# Patient Record
Sex: Male | Born: 1955 | Race: White | Hispanic: No | Marital: Married | Smoking: Former smoker
Health system: Southern US, Community
[De-identification: ages and names within clinical notes are randomized; demographics above are authoritative.]

## PROBLEM LIST (undated history)

## (undated) DIAGNOSIS — E119 Type 2 diabetes mellitus without complications: Secondary | ICD-10-CM

## (undated) HISTORY — PX: SHOULDER ARTHROSCOPY: SHX128

## (undated) HISTORY — PX: COLONOSCOPY: SHX174

## (undated) HISTORY — PX: UMBILICAL HERNIA REPAIR: SHX196

---

## 2000-08-24 ENCOUNTER — Other Ambulatory Visit: Admission: RE | Admit: 2000-08-24 | Discharge: 2000-08-24 | Payer: Self-pay | Admitting: Urology

## 2007-10-26 ENCOUNTER — Ambulatory Visit (HOSPITAL_COMMUNITY): Admission: RE | Admit: 2007-10-26 | Discharge: 2007-10-26 | Payer: Self-pay | Admitting: Family Medicine

## 2008-03-24 ENCOUNTER — Emergency Department (HOSPITAL_COMMUNITY): Admission: EM | Admit: 2008-03-24 | Discharge: 2008-03-24 | Payer: Self-pay | Admitting: Emergency Medicine

## 2008-12-30 ENCOUNTER — Ambulatory Visit (HOSPITAL_COMMUNITY): Admission: RE | Admit: 2008-12-30 | Discharge: 2008-12-30 | Payer: Self-pay | Admitting: General Surgery

## 2009-02-16 ENCOUNTER — Ambulatory Visit (HOSPITAL_COMMUNITY): Admission: RE | Admit: 2009-02-16 | Discharge: 2009-02-16 | Payer: Self-pay | Admitting: General Surgery

## 2009-03-26 DIAGNOSIS — D229 Melanocytic nevi, unspecified: Secondary | ICD-10-CM

## 2009-03-26 HISTORY — DX: Melanocytic nevi, unspecified: D22.9

## 2010-04-11 LAB — CBC
HCT: 45.4 % (ref 39.0–52.0)
Hemoglobin: 15.3 g/dL (ref 13.0–17.0)
MCHC: 33.8 g/dL (ref 30.0–36.0)
MCV: 87.6 fL (ref 78.0–100.0)
Platelets: 199 10*3/uL (ref 150–400)
RBC: 5.18 MIL/uL (ref 4.22–5.81)
RDW: 12.8 % (ref 11.5–15.5)
WBC: 4.1 10*3/uL (ref 4.0–10.5)

## 2010-04-11 LAB — BASIC METABOLIC PANEL
BUN: 15 mg/dL (ref 6–23)
CO2: 26 mEq/L (ref 19–32)
Calcium: 9.5 mg/dL (ref 8.4–10.5)
Chloride: 102 mEq/L (ref 96–112)
Creatinine, Ser: 0.83 mg/dL (ref 0.4–1.5)
GFR calc Af Amer: >60 mL/min (ref >60)
GFR calc non Af Amer: >60 mL/min (ref >60)
Glucose, Bld: 101 mg/dL — ABNORMAL HIGH (ref 70–99)
Potassium: 4.2 mEq/L (ref 3.5–5.1)
Sodium: 136 mEq/L (ref 135–145)

## 2012-05-29 ENCOUNTER — Ambulatory Visit (HOSPITAL_BASED_OUTPATIENT_CLINIC_OR_DEPARTMENT_OTHER): Payer: BC Managed Care – PPO | Attending: Family Medicine | Admitting: Radiology

## 2012-05-29 VITALS — Ht 66.0 in | Wt 235.0 lb

## 2012-05-29 DIAGNOSIS — G4733 Obstructive sleep apnea (adult) (pediatric): Secondary | ICD-10-CM

## 2012-06-02 DIAGNOSIS — G4733 Obstructive sleep apnea (adult) (pediatric): Secondary | ICD-10-CM

## 2012-06-02 NOTE — Procedures (Signed)
NAMEAVERIE, Samuel Madden NO.:  [id number]  MEDICAL RECORD NO.:  [id number]          PATIENT TYPE:  OUT  LOCATION:  SLEEP CENTER                 FACILITY:  [facility]  PHYSICIAN:  Tiajah Oyster D. Maple Hudson, MD, FCCP, FACPDATE OF BIRTH:  25-Jun-1955  DATE OF STUDY:  05/29/2012                           NOCTURNAL POLYSOMNOGRAM  REFERRING PHYSICIAN:  MICHAEL C BADGER  INDICATION FOR STUDY:  Hypersomnia with sleep apnea.  EPWORTH SLEEPINESS SCORE:  11/24.  BMI 37.9.  Weight 235 pounds, height 66 inches.  Neck 17.5 inches.  MEDICATIONS:  Home medications are charted and reviewed.  SLEEP ARCHITECTURE:  Split study protocol.  During the diagnostic phase, total sleep time 120.5 minutes with sleep efficiency 85.2%.  Stage I was 7.1%, stage II 84.2%, stage III absent.  REM 8.7% of total sleep time. Sleep latency 17.5 minutes, REM latency 35 minutes.  Awake after sleep onset 6 minutes.  Arousal index 20.4.  Bedtime medication:  None.  RESPIRATORY DATA:  Split study protocol.  Apnea/hypopnea index (AHI) 22.9 per hour.  A total of 46 events was scored including 1 obstructive apnea and 45 hypopneas.  Events were associated with supine sleep position.  REM AHI 51.4 per hour.  CPAP was titrated to 11 CWP, AHI 0 per hour.  He wore a medium F and P Eson mask with heated humidifier.  OXYGEN DATA:  Moderate snoring before CPAP with oxygen desaturation to a nadir of 72% on room air.  With CPAP titration, snoring was prevented and mean oxygen saturation held 94.7% on room air.  CARDIAC DATA:  Normal sinus rhythm.  MOVEMENT/PARASOMNIA:  No significant movement disturbance.  No bathroom trips.  IMPRESSION/RECOMMENDATION: 1. Moderate obstructive sleep apnea/hypopnea syndrome, AHI 22.9 per     hour with supine events.  Moderate snoring with oxygen desaturation     to a nadir of 72% on room air. 2. Successful CPAP titration to 11 CWP, AHI 0 per hour.  He wore a     medium F and P Eson mask with  heated humidifier.  Snoring was     prevented and mean oxygen saturation held 94.7% on room air with     CPAP.     Jazmene Racz D. Maple Hudson, MD, [facility], FACP Diplomate, American Board of Sleep Medicine    CDY/MEDQ  D:  06/02/2012 14:01:00  T:  06/02/2012 19:47:33  Job:  [id number]

## 2013-04-04 ENCOUNTER — Other Ambulatory Visit: Payer: Self-pay | Admitting: Physician Assistant

## 2016-05-25 DIAGNOSIS — R7303 Prediabetes: Secondary | ICD-10-CM | POA: Diagnosis present

## 2016-12-06 ENCOUNTER — Other Ambulatory Visit: Payer: Self-pay | Admitting: Orthopedic Surgery

## 2016-12-06 DIAGNOSIS — M25521 Pain in right elbow: Secondary | ICD-10-CM

## 2016-12-06 DIAGNOSIS — T148XXA Other injury of unspecified body region, initial encounter: Secondary | ICD-10-CM

## 2016-12-26 ENCOUNTER — Other Ambulatory Visit: Payer: Self-pay

## 2018-02-08 ENCOUNTER — Other Ambulatory Visit: Payer: Self-pay | Admitting: Physician Assistant

## 2018-04-17 ENCOUNTER — Other Ambulatory Visit: Payer: Self-pay | Admitting: Physician Assistant

## 2019-03-31 ENCOUNTER — Ambulatory Visit: Payer: Self-pay | Attending: Internal Medicine

## 2019-03-31 DIAGNOSIS — Z23 Encounter for immunization: Secondary | ICD-10-CM | POA: Insufficient documentation

## 2019-03-31 NOTE — Progress Notes (Signed)
   Covid-19 Vaccination Clinic  Name:  Samuel Madden    MRN: [id number] DOB: [birth year]  03/31/2019  Samuel Madden was observed post Covid-19 immunization for 15 minutes without incident. He was provided with Vaccine Information Sheet and instruction to access the V-Safe system.   Samuel Madden was instructed to call 911 with any severe reactions post vaccine: Marland Kitchen Difficulty breathing  . Swelling of face and throat  . A fast heartbeat  . A bad rash all over body  . Dizziness and weakness   Immunizations Administered    Name Date Dose VIS Date Route   Pfizer COVID-19 Vaccine 03/31/2019  5:54 PM 0.3 mL 01/04/2019 Intramuscular   Manufacturer: [place]   Lot: [id number]   [place]: [id number]

## 2019-05-01 ENCOUNTER — Ambulatory Visit: Payer: Self-pay | Attending: Internal Medicine

## 2019-05-01 DIAGNOSIS — Z23 Encounter for immunization: Secondary | ICD-10-CM

## 2019-05-01 NOTE — Progress Notes (Signed)
   Covid-19 Vaccination Clinic  Name:  Samuel Madden    MRN: [id number] DOB: 02/06/1955  05/01/2019  [name] was observed post Covid-19 immunization for 15 minutes without incident. He was provided with Vaccine Information Sheet and instruction to access the V-Safe system.   [name] was instructed to call 911 with any severe reactions post vaccine: Marland Kitchen Difficulty breathing  . Swelling of face and throat  . A fast heartbeat  . A bad rash all over body  . Dizziness and weakness   Immunizations Administered    Name Date Dose VIS Date Route   Pfizer COVID-19 Vaccine 05/01/2019  9:13 AM 0.3 mL 01/04/2019 Intramuscular   Manufacturer: [employer], [employer]   Lot: [id number]   [place]: [id number]

## 2019-11-04 DIAGNOSIS — Z23 Encounter for immunization: Secondary | ICD-10-CM | POA: Diagnosis not present

## 2019-11-14 ENCOUNTER — Ambulatory Visit: Payer: Self-pay | Admitting: Dermatology

## 2019-11-19 ENCOUNTER — Ambulatory Visit: Payer: Self-pay | Admitting: Dermatology

## 2019-11-21 DIAGNOSIS — Z23 Encounter for immunization: Secondary | ICD-10-CM | POA: Diagnosis not present

## 2019-12-03 ENCOUNTER — Other Ambulatory Visit: Payer: Self-pay

## 2019-12-03 ENCOUNTER — Encounter: Payer: Self-pay | Admitting: Dermatology

## 2019-12-03 ENCOUNTER — Ambulatory Visit: Payer: BC Managed Care – PPO | Admitting: Dermatology

## 2019-12-03 DIAGNOSIS — Z1283 Encounter for screening for malignant neoplasm of skin: Secondary | ICD-10-CM | POA: Diagnosis not present

## 2019-12-03 DIAGNOSIS — D2371 Other benign neoplasm of skin of right lower limb, including hip: Secondary | ICD-10-CM | POA: Diagnosis not present

## 2019-12-03 DIAGNOSIS — H04123 Dry eye syndrome of bilateral lacrimal glands: Secondary | ICD-10-CM | POA: Diagnosis not present

## 2019-12-03 DIAGNOSIS — L739 Follicular disorder, unspecified: Secondary | ICD-10-CM

## 2019-12-03 DIAGNOSIS — H2513 Age-related nuclear cataract, bilateral: Secondary | ICD-10-CM | POA: Diagnosis not present

## 2019-12-03 DIAGNOSIS — D239 Other benign neoplasm of skin, unspecified: Secondary | ICD-10-CM

## 2019-12-03 DIAGNOSIS — H524 Presbyopia: Secondary | ICD-10-CM | POA: Diagnosis not present

## 2019-12-03 MED ORDER — MINOCYCLINE HCL 50 MG PO CAPS: 50.0000 mg | ORAL_CAPSULE | Freq: Two times a day (BID) | ORAL | 5 refills | Status: DC

## 2019-12-03 NOTE — Progress Notes (Signed)
   Follow-Up Visit   Subjective  Samuel Madden is a 64 y.o. male who presents for the following: Annual Exam (no new concerns other than a rash that comes and goes on the back of the scalp/neck- + itch, painful blisters- tx-  OTC products). General skin check Location:  Duration:  Quality:  Associated Signs/Symptoms: Modifying Factors:  Severity:  Timing: Context: Also history of recurrent blisterlike sores on back of scalp.  Not on torso  Objective  Well appearing patient in no apparent distress; mood and affect are within normal limits.  A full examination was performed including scalp, head, eyes, ears, nose, lips, neck, chest, axillae, abdomen, back, buttocks, bilateral upper extremities, bilateral lower extremities, hands, feet, fingers, toes, fingernails, and toenails. All findings within normal limits unless otherwise noted below.   Assessment & Plan    Skin exam for malignant neoplasm Chest - [facility])  Yearly skin checks  Folliculitis Mid Back  Use MCN 50mg  when flares appear.  minocycline (MINOCIN) 50 MG capsule - Mid Back  Dermatofibroma Right Lower Leg - Anterior  Benign okay to leave. Unless patient wants removed.   General skin examination from legs to scalp today.  There are no recurrent or new atypical moles or skin cancer.  On the right shin he has firm 4 mm dermal papule called a dermatofibroma which is safe to leave if stable.  On the hands and the tops of the feet (by history) and the left shin are rough textured bumps called keratoses which are intrinsically benign.  He has extensive follicular breakouts on the scalp and the torso which represent a mixture of folliculitis plus adult acne.  He may restart the oral minocycline 50 mg twice daily if there is a significant flare.  He is well versed on understanding the undesirability of chronic oral antibiotics.  To try to minimize the need for oral antibiotics, I recommend he wash more or less daily  with any over-the-counter benzyl peroxide cleanser.  The easiest might be a PanOxyl bar (either 5 or 10%), but if he is unable to find this there a dozen benzyl peroxide cleansers in the acne section of any over-the-counter area of the grocery store or [place] or [employer].  There is a 1% chance of not being able to tolerate the PanOxyl because of itching or rash.  I will plan on doing a general skin check on [name] annually but he knows that he could call me with any questions.   I, Lavonna Monarch, MD, have reviewed all documentation for this visit.  The documentation on 12/03/19 for the exam, diagnosis, procedures, and orders are all accurate and complete.

## 2019-12-03 NOTE — Patient Instructions (Addendum)
Pick up Panoxyl General skin examination from legs to scalp today.  There are no recurrent or new atypical moles or skin cancer.  On the right shin he has firm 4 mm dermal papule called a dermatofibroma which is safe to leave if stable.  On the hands and the tops of the feet (by history) and the left shin are rough textured bumps called keratoses which are intrinsically benign.  He has extensive follicular breakouts on the scalp and the torso which represent a mixture of folliculitis plus adult acne.  He may restart the oral minocycline 50 mg twice daily if there is a significant flare.  He is well versed on understanding the undesirability of chronic oral antibiotics.  To try to minimize the need for oral antibiotics, I recommend he wash more or less daily with any over-the-counter benzyl peroxide cleanser.  The easiest might be a PanOxyl bar (either 5 or 10%), but if he is unable to find this there a dozen benzyl peroxide cleansers in the acne section of any over-the-counter area of the grocery store or [place] or [employer].  There is a 1% chance of not being able to tolerate the PanOxyl because of itching or rash.  I will plan on doing a general skin check on Mr. Duzan annually but he knows that he could call me with any questions.

## 2019-12-16 ENCOUNTER — Encounter: Payer: Self-pay | Admitting: Dermatology

## 2019-12-17 ENCOUNTER — Ambulatory Visit: Payer: Self-pay

## 2019-12-29 DIAGNOSIS — J018 Other acute sinusitis: Secondary | ICD-10-CM | POA: Diagnosis not present

## 2019-12-29 DIAGNOSIS — Z03818 Encounter for observation for suspected exposure to other biological agents ruled out: Secondary | ICD-10-CM | POA: Diagnosis not present

## 2019-12-29 DIAGNOSIS — U071 COVID-19: Secondary | ICD-10-CM | POA: Diagnosis not present

## 2019-12-29 DIAGNOSIS — R0602 Shortness of breath: Secondary | ICD-10-CM | POA: Diagnosis not present

## 2019-12-29 DIAGNOSIS — R9389 Abnormal findings on diagnostic imaging of other specified body structures: Secondary | ICD-10-CM | POA: Diagnosis not present

## 2020-05-18 ENCOUNTER — Other Ambulatory Visit: Payer: Self-pay | Admitting: Dermatology

## 2020-05-18 DIAGNOSIS — L739 Follicular disorder, unspecified: Secondary | ICD-10-CM

## 2020-06-08 DIAGNOSIS — R0989 Other specified symptoms and signs involving the circulatory and respiratory systems: Secondary | ICD-10-CM | POA: Diagnosis not present

## 2020-06-08 DIAGNOSIS — R059 Cough, unspecified: Secondary | ICD-10-CM | POA: Diagnosis not present

## 2020-06-08 DIAGNOSIS — R490 Dysphonia: Secondary | ICD-10-CM | POA: Diagnosis not present

## 2020-06-08 DIAGNOSIS — R0602 Shortness of breath: Secondary | ICD-10-CM | POA: Diagnosis not present

## 2020-06-12 DIAGNOSIS — R7303 Prediabetes: Secondary | ICD-10-CM | POA: Diagnosis not present

## 2020-06-12 DIAGNOSIS — E291 Testicular hypofunction: Secondary | ICD-10-CM | POA: Diagnosis not present

## 2020-06-12 DIAGNOSIS — E7800 Pure hypercholesterolemia, unspecified: Secondary | ICD-10-CM | POA: Diagnosis not present

## 2020-06-12 DIAGNOSIS — E349 Endocrine disorder, unspecified: Secondary | ICD-10-CM | POA: Diagnosis not present

## 2020-06-12 DIAGNOSIS — Z0000 Encounter for general adult medical examination without abnormal findings: Secondary | ICD-10-CM | POA: Diagnosis not present

## 2020-06-16 DIAGNOSIS — E349 Endocrine disorder, unspecified: Secondary | ICD-10-CM | POA: Diagnosis not present

## 2020-06-16 DIAGNOSIS — Z0000 Encounter for general adult medical examination without abnormal findings: Secondary | ICD-10-CM | POA: Diagnosis not present

## 2020-06-16 DIAGNOSIS — Z8616 Personal history of COVID-19: Secondary | ICD-10-CM | POA: Diagnosis not present

## 2020-07-02 DIAGNOSIS — R059 Cough, unspecified: Secondary | ICD-10-CM | POA: Diagnosis not present

## 2020-07-08 DIAGNOSIS — R059 Cough, unspecified: Secondary | ICD-10-CM | POA: Diagnosis not present

## 2020-07-08 DIAGNOSIS — R000 Tachycardia, unspecified: Secondary | ICD-10-CM | POA: Diagnosis not present

## 2020-07-08 DIAGNOSIS — R0789 Other chest pain: Secondary | ICD-10-CM | POA: Diagnosis not present

## 2020-07-14 DIAGNOSIS — R972 Elevated prostate specific antigen [PSA]: Secondary | ICD-10-CM | POA: Diagnosis not present

## 2020-07-20 ENCOUNTER — Encounter (HOSPITAL_BASED_OUTPATIENT_CLINIC_OR_DEPARTMENT_OTHER): Payer: Self-pay | Admitting: Emergency Medicine

## 2020-07-20 ENCOUNTER — Emergency Department (HOSPITAL_BASED_OUTPATIENT_CLINIC_OR_DEPARTMENT_OTHER): Payer: BC Managed Care – PPO

## 2020-07-20 ENCOUNTER — Inpatient Hospital Stay (HOSPITAL_BASED_OUTPATIENT_CLINIC_OR_DEPARTMENT_OTHER)
Admission: EM | Admit: 2020-07-20 | Discharge: 2020-07-23 | DRG: 175 | Disposition: A | Discharge status: Home / Self Care | Payer: BC Managed Care – PPO | Attending: Internal Medicine | Admitting: Internal Medicine

## 2020-07-20 ENCOUNTER — Other Ambulatory Visit: Payer: Self-pay

## 2020-07-20 DIAGNOSIS — G4733 Obstructive sleep apnea (adult) (pediatric): Secondary | ICD-10-CM | POA: Diagnosis not present

## 2020-07-20 DIAGNOSIS — Z7982 Long term (current) use of aspirin: Secondary | ICD-10-CM

## 2020-07-20 DIAGNOSIS — Z6841 Body Mass Index (BMI) 40.0 and over, adult: Secondary | ICD-10-CM | POA: Diagnosis not present

## 2020-07-20 DIAGNOSIS — E119 Type 2 diabetes mellitus without complications: Secondary | ICD-10-CM | POA: Diagnosis present

## 2020-07-20 DIAGNOSIS — I517 Cardiomegaly: Secondary | ICD-10-CM | POA: Diagnosis not present

## 2020-07-20 DIAGNOSIS — R079 Chest pain, unspecified: Secondary | ICD-10-CM | POA: Diagnosis not present

## 2020-07-20 DIAGNOSIS — R7303 Prediabetes: Secondary | ICD-10-CM | POA: Diagnosis present

## 2020-07-20 DIAGNOSIS — R0602 Shortness of breath: Secondary | ICD-10-CM | POA: Diagnosis not present

## 2020-07-20 DIAGNOSIS — Z20822 Contact with and (suspected) exposure to covid-19: Secondary | ICD-10-CM | POA: Diagnosis not present

## 2020-07-20 DIAGNOSIS — L739 Follicular disorder, unspecified: Secondary | ICD-10-CM | POA: Diagnosis not present

## 2020-07-20 DIAGNOSIS — J8410 Pulmonary fibrosis, unspecified: Secondary | ICD-10-CM | POA: Diagnosis not present

## 2020-07-20 DIAGNOSIS — J90 Pleural effusion, not elsewhere classified: Secondary | ICD-10-CM | POA: Diagnosis not present

## 2020-07-20 DIAGNOSIS — E669 Obesity, unspecified: Secondary | ICD-10-CM | POA: Diagnosis not present

## 2020-07-20 DIAGNOSIS — J9601 Acute respiratory failure with hypoxia: Secondary | ICD-10-CM | POA: Diagnosis present

## 2020-07-20 DIAGNOSIS — E785 Hyperlipidemia, unspecified: Secondary | ICD-10-CM | POA: Diagnosis present

## 2020-07-20 DIAGNOSIS — Z79890 Hormone replacement therapy (postmenopausal): Secondary | ICD-10-CM | POA: Diagnosis not present

## 2020-07-20 DIAGNOSIS — I2699 Other pulmonary embolism without acute cor pulmonale: Principal | ICD-10-CM

## 2020-07-20 DIAGNOSIS — Z79899 Other long term (current) drug therapy: Secondary | ICD-10-CM | POA: Diagnosis not present

## 2020-07-20 DIAGNOSIS — J9811 Atelectasis: Secondary | ICD-10-CM | POA: Diagnosis not present

## 2020-07-20 DIAGNOSIS — J811 Chronic pulmonary edema: Secondary | ICD-10-CM | POA: Diagnosis not present

## 2020-07-20 DIAGNOSIS — I2602 Saddle embolus of pulmonary artery with acute cor pulmonale: Secondary | ICD-10-CM | POA: Diagnosis not present

## 2020-07-20 DIAGNOSIS — Z9989 Dependence on other enabling machines and devices: Secondary | ICD-10-CM

## 2020-07-20 HISTORY — DX: Type 2 diabetes mellitus without complications: E11.9

## 2020-07-20 LAB — HEPARIN LEVEL (UNFRACTIONATED): Heparin Unfractionated: 0.11 IU/mL — ABNORMAL LOW (ref 0.30–0.70)

## 2020-07-20 LAB — RESP PANEL BY RT-PCR (FLU A&B, COVID) ARPGX2
Influenza A by PCR: NEGATIVE
Influenza B by PCR: NEGATIVE
SARS Coronavirus 2 by RT PCR: NEGATIVE

## 2020-07-20 LAB — CBC
HCT: 47.5 % (ref 39.0–52.0)
Hemoglobin: 15.4 g/dL (ref 13.0–17.0)
MCH: 28.6 pg (ref 26.0–34.0)
MCHC: 32.4 g/dL (ref 30.0–36.0)
MCV: 88.3 fL (ref 80.0–100.0)
Platelets: 149 10*3/uL — ABNORMAL LOW (ref 150–400)
RBC: 5.38 MIL/uL (ref 4.22–5.81)
RDW: 14.4 % (ref 11.5–15.5)
WBC: 7.4 10*3/uL (ref 4.0–10.5)
nRBC: 0 % (ref 0.0–0.2)

## 2020-07-20 LAB — BASIC METABOLIC PANEL
Anion gap: 10 (ref 5–15)
BUN: 12 mg/dL (ref 8–23)
CO2: 25 mmol/L (ref 22–32)
Calcium: 8.9 mg/dL (ref 8.9–10.3)
Chloride: 102 mmol/L (ref 98–111)
Creatinine, Ser: 0.97 mg/dL (ref 0.61–1.24)
GFR, Estimated: >60 mL/min (ref >60)
Glucose, Bld: 144 mg/dL — ABNORMAL HIGH (ref 70–99)
Potassium: 4 mmol/L (ref 3.5–5.1)
Sodium: 137 mmol/L (ref 135–145)

## 2020-07-20 LAB — GLUCOSE, CAPILLARY: Glucose-Capillary: 107 mg/dL — ABNORMAL HIGH (ref 70–99)

## 2020-07-20 LAB — D-DIMER, QUANTITATIVE: D-Dimer, Quant: 2.99 ug/mL-FEU — ABNORMAL HIGH (ref 0.00–0.50)

## 2020-07-20 LAB — BRAIN NATRIURETIC PEPTIDE: B Natriuretic Peptide: 21.6 pg/mL (ref 0.0–100.0)

## 2020-07-20 LAB — TROPONIN I (HIGH SENSITIVITY)
Troponin I (High Sensitivity): 13 ng/L (ref <18)
Troponin I (High Sensitivity): 15 ng/L (ref <18)

## 2020-07-20 LAB — HIV ANTIBODY (ROUTINE TESTING W REFLEX): HIV Screen 4th Generation wRfx: NONREACTIVE

## 2020-07-20 MED ORDER — ACETAMINOPHEN 650 MG RE SUPP: 650.0000 mg | Freq: Four times a day (QID) | RECTAL | Status: DC | PRN

## 2020-07-20 MED ORDER — ROSUVASTATIN CALCIUM 20 MG PO TABS
20.0000 mg | ORAL_TABLET | Freq: Every day | ORAL | Status: DC
  Administered 2020-07-20 – 2020-07-23 (×4): 20 mg via ORAL
  Filled 2020-07-20 (×4): qty 1

## 2020-07-20 MED ORDER — HYDROCODONE-ACETAMINOPHEN 5-325 MG PO TABS
1.0000 | ORAL_TABLET | Freq: Four times a day (QID) | ORAL | Status: DC | PRN
  Administered 2020-07-21: 1 via ORAL
  Filled 2020-07-20: qty 1

## 2020-07-20 MED ORDER — ACETAMINOPHEN 325 MG PO TABS
650.0000 mg | ORAL_TABLET | Freq: Four times a day (QID) | ORAL | Status: DC | PRN
  Administered 2020-07-21: 650 mg via ORAL
  Filled 2020-07-20: qty 2

## 2020-07-20 MED ORDER — TADALAFIL 5 MG PO TABS
5.0000 mg | ORAL_TABLET | Freq: Every day | ORAL | Status: DC
  Administered 2020-07-20 – 2020-07-21 (×2): 5 mg via ORAL
  Filled 2020-07-20 (×2): qty 1

## 2020-07-20 MED ORDER — IOHEXOL 350 MG/ML SOLN
100.0000 mL | Freq: Once | INTRAVENOUS | Status: AC | PRN
  Administered 2020-07-20: 100 mL via INTRAVENOUS

## 2020-07-20 MED ORDER — MINOCYCLINE HCL 50 MG PO CAPS
50.0000 mg | ORAL_CAPSULE | Freq: Every day | ORAL | Status: DC
  Administered 2020-07-20 – 2020-07-23 (×4): 50 mg via ORAL
  Filled 2020-07-20 (×4): qty 1

## 2020-07-20 MED ORDER — SODIUM CHLORIDE 0.9 % IV SOLN: 250.0000 mL | INTRAVENOUS | Status: DC | PRN

## 2020-07-20 MED ORDER — GUAIFENESIN-DM 100-10 MG/5ML PO SYRP
5.0000 mL | ORAL_SOLUTION | ORAL | Status: DC | PRN
  Administered 2020-07-20 – 2020-07-21 (×3): 5 mL via ORAL
  Filled 2020-07-20 (×4): qty 5

## 2020-07-20 MED ORDER — FLUTICASONE PROPIONATE 50 MCG/ACT NA SUSP
1.0000 | Freq: Every day | NASAL | Status: DC | PRN
Start: 2020-07-20 — End: 2020-07-23
  Administered 2020-07-21: 2 via NASAL
  Filled 2020-07-20: qty 16

## 2020-07-20 MED ORDER — SODIUM CHLORIDE 0.9% FLUSH: 3.0000 mL | INTRAVENOUS | Status: DC | PRN

## 2020-07-20 MED ORDER — HEPARIN BOLUS VIA INFUSION
5000.0000 [IU] | Freq: Once | INTRAVENOUS | Status: AC
  Administered 2020-07-20: 5000 [IU] via INTRAVENOUS

## 2020-07-20 MED ORDER — HEPARIN (PORCINE) 25000 UT/250ML-% IV SOLN
1750.0000 [IU]/h | INTRAVENOUS | Status: DC
  Administered 2020-07-20: 1500 [IU]/h via INTRAVENOUS
  Administered 2020-07-20: 1750 [IU]/h via INTRAVENOUS
  Filled 2020-07-20 (×2): qty 250

## 2020-07-20 MED ORDER — SODIUM CHLORIDE 0.9% FLUSH
3.0000 mL | Freq: Two times a day (BID) | INTRAVENOUS | Status: DC
  Administered 2020-07-20 – 2020-07-23 (×6): 3 mL via INTRAVENOUS

## 2020-07-20 MED ORDER — HEPARIN BOLUS VIA INFUSION
4000.0000 [IU] | Freq: Once | INTRAVENOUS | Status: AC
  Administered 2020-07-20: 4000 [IU] via INTRAVENOUS
  Filled 2020-07-20: qty 4000

## 2020-07-20 NOTE — ED Provider Notes (Signed)
[facility] EMERGENCY DEPARTMENT Provider Note   CSN: [id number] Arrival date & time: 07/20/20  1011     History Chief Complaint  Patient presents with   Shortness of Breath    Westley JERAMINE DELIS is a 65 y.o. male.  The history is provided by the patient and medical records. No language interpreter was used.  Shortness of Breath  65 year old male with history of diabetes who presents for evaluation of shortness of breath.  Patient report for the past 8 weeks he has had trouble with shortness of breath.  His first started when he was exposed to sheet rock dust when his house was being renovated 8 weeks ago.  He states that he has had cough, increased shortness of breath with walking, and initially did have some subjective fever and chills.  Was seen by his doctor, was diagnosed with bronchitis and treated with antibiotic and steroid.  Symptoms did improve for a few weeks and when he went to see his PCP about 4 weeks ago for a checkup, he was giving another dose of steroid and antibiotic which did help with his symptoms.  However for the past several days he noticed progressive worsening shortness of breath, cough with specks of blood, and fatigue.  He also endorsed intermittent ribs pain during these episodes.  He did mention extensive travel during these episodes but denies any prior history of PE or DVT in the past.  Did endorse occasional calf tenderness prior to this incident but is not unusual for him.  He reported his brother has had blood clots in the past unsure what provoked it.  He does have a home oximeter and yesterday when he checked his O2 sats was at 85% prompting this ER visit.  He denies tobacco use, endorse occasional alcohol use.  Denies any significant cardiac history.  He has been vaccinated for COVID-19, has had several negative COVID test previously.  No history of asthma or COPD  Past Medical History:  Diagnosis Date   Atypical mole 03/26/2009   mild-left mid  back   Diabetes mellitus without complication ([place])     There are no problems to display for this patient.   History reviewed. No pertinent surgical history.     No family history on file.     Home Medications Prior to Admission medications   Medication Sig Start Date End Date Taking? Authorizing Provider  Aspirin Buf,CaCarb-MgCarb-MgO, 81 MG TABS Take by mouth.    [provider]  minocycline (MINOCIN) 50 MG capsule TAKE ONE CAPSULE BY MOUTH TWICE A DAY 05/18/20   Lavonna Monarch, MD  rosuvastatin (CRESTOR) 20 MG tablet Take 1 tablet by mouth daily. 10/16/17   [provider]  tadalafil (CIALIS) 5 MG tablet Take 1 tablet by mouth daily. 11/25/17   [provider]  Testosterone 30 MG/ACT SOLN Place onto the skin. 10/16/17   [provider]    Allergies    Patient has no known allergies.  Review of Systems   Review of Systems  Respiratory:  Positive for shortness of breath.   All other systems reviewed and are negative.  Physical Exam Updated Vital Signs BP (!) 144/85   Pulse 99   Temp 98.3 F (36.8 C) (Oral)   Resp (!) 26   Ht 5\' 6"  (1.676 m)   Wt 113.4 kg   SpO2 91%   BMI 40.35 kg/m   Physical Exam Vitals and nursing note reviewed.  Constitutional:      General:  He is not in acute distress.    Appearance: He is well-developed.  HENT:     Head: Atraumatic.  Eyes:     Conjunctiva/sclera: Conjunctivae normal.  Cardiovascular:     Rate and Rhythm: Tachycardia present.  Pulmonary:     Effort: Pulmonary effort is normal.     Breath sounds: Normal breath sounds. No decreased breath sounds, wheezing, rhonchi or rales.  Chest:     Chest wall: No tenderness.  Abdominal:     Palpations: Abdomen is soft.  Musculoskeletal:     Cervical back: Neck supple.     Right lower leg: No edema.     Left lower leg: No edema.  Skin:    Findings: No rash.  Neurological:     Mental Status: He is alert. Mental status is at baseline.   Psychiatric:        Mood and Affect: Mood normal.    ED Results / Procedures / Treatments   Labs (all labs ordered are listed, but only abnormal results are displayed) Labs Reviewed  BASIC METABOLIC PANEL - Abnormal; Notable for the following components:      Result Value   Glucose, Bld 144 (*)    All other components within normal limits  CBC - Abnormal; Notable for the following components:   Platelets 149 (*)    All other components within normal limits  D-DIMER, QUANTITATIVE - Abnormal; Notable for the following components:   D-Dimer, Quant 2.99 (*)    All other components within normal limits  RESP PANEL BY RT-PCR (FLU A&B, COVID) ARPGX2  HEPARIN LEVEL (UNFRACTIONATED)  TROPONIN I (HIGH SENSITIVITY)  TROPONIN I (HIGH SENSITIVITY)    EKG EKG Interpretation  Date/Time:  Monday July 20 2020 10:27:30 EDT Ventricular Rate:  99 PR Interval:  150 QRS Duration: 88 QT Interval:  342 QTC Calculation: 439 R Axis:   81 Text Interpretation: Sinus rhythm Borderline right axis deviation Borderline T wave abnormalities No old tracing to compare Confirmed by Sherwood Gambler [phone]) on 07/20/2020 11:08:20 AM  Radiology CT Angio Chest PE W and/or Wo Contrast  Result Date: 07/20/2020 CLINICAL DATA:  Hyper bubble ED pulmonary embolism Shortness of breath Rib and sternal pain Recent airplane travel EXAM: CT ANGIOGRAPHY CHEST WITH CONTRAST TECHNIQUE: Multidetector CT imaging of the chest was performed using the standard protocol during bolus administration of intravenous contrast. Multiplanar CT image reconstructions and MIPs were obtained to evaluate the vascular anatomy. CONTRAST:  123mL OMNIPAQUE IOHEXOL 350 MG/ML SOLN COMPARISON:  None. FINDINGS: Cardiovascular: Heart size at upper limits of normal. Mediastinal lipomatosis. Bilateral pulmonary artery emboli involving distal main, lobar, segmental, and subsegmental branches. Burden of clot is greater on the right. Mildly elevated RV/LV ratio  measuring 1.1. Mediastinum/Nodes: No enlarged mediastinal axillary, supraclavicular, or hilar lymph nodes. Lungs/Pleura: Scattered calcified granulomas seen bilaterally.Small left pleural effusion is present with adjacent atelectasis. Upper Abdomen: No acute abnormality. Musculoskeletal: No chest wall abnormality. No acute or significant osseous findings. Review of the MIP images confirms the above findings. IMPRESSION: 1. Bilateral pulmonary artery embolism involving distal main, lobar, segmental and subsegmental branches. Clot burden is greater on the right. 2. Small left pleural effusion with adjacent atelectasis. 3. Mildly elevated RV/LV ratio measuring 1.1. Electronically Signed   By: Miachel Roux M.D.   On: 07/20/2020 11:50   DG Chest Portable 1 View  Result Date: 07/20/2020 CLINICAL DATA:  Shortness of breath. EXAM: PORTABLE CHEST 1 VIEW COMPARISON:  None. FINDINGS: 1040 hours. Left base collapse/consolidation with small  to moderate left pleural effusion. No focal airspace consolidation or substantial effusion in the right lung. The cardio pericardial silhouette is enlarged. There is pulmonary vascular congestion without overt pulmonary edema. The visualized bony structures of the thorax show no acute abnormality. Telemetry leads overlie the chest. IMPRESSION: Left base collapse/consolidation with small to moderate left pleural effusion. Cardiomegaly with vascular congestion. Electronically Signed   By: Misty Stanley M.D.   On: 07/20/2020 11:10    Procedures .Critical Care  Date/Time: 07/20/2020 12:41 PM Performed by: Domenic Moras, PA-C Authorized by: Domenic Moras, PA-C   Critical care provider statement:    Critical care time (minutes):  45   Critical care was time spent personally by me on the following activities:  Discussions with consultants, evaluation of patient's response to treatment, examination of patient, ordering and performing treatments and interventions, ordering and review of  laboratory studies, ordering and review of radiographic studies, pulse oximetry, re-evaluation of patient's condition, obtaining history from patient or surrogate and review of old charts   Medications Ordered in ED Medications  heparin ADULT infusion 100 units/mL (25000 units/254mL) (1,500 Units/hr Intravenous New Bag/Given 07/20/20 1233)  iohexol (OMNIPAQUE) 350 MG/ML injection 100 mL (100 mLs Intravenous Contrast Given 07/20/20 1123)  heparin bolus via infusion 5,000 Units (5,000 Units Intravenous Bolus from Bag 07/20/20 1234)    ED Course  I have reviewed the triage vital signs and the nursing notes.  Pertinent labs & imaging results that were available during my care of the patient were reviewed by me and considered in my medical decision making (see chart for details).    MDM Rules/Calculators/A&P                          BP 117/72   Pulse 85   Temp 98.3 F (36.8 C) (Oral)   Resp (!) 28   Ht 5\' 6"  (1.676 m)   Wt 113.4 kg   SpO2 96%   BMI 40.35 kg/m   Final Clinical Impression(s) / ED Diagnoses Final diagnoses:  Bilateral pulmonary embolism ([place])    Rx / DC Orders ED Discharge Orders     None      10:56 AM Patient is here with complaints of progressive worsening shortness of breath.  Has been treated for suspected chronic bronchitis with multiple dose of steroids and antibiotic in the past 8 weeks without resolution of his symptoms.  At home O2 sats was at 85% yesterday.  Here, O2 sats at 91% with ambulation.  He does admits to extensive travel which predispose him to potential PE.  Care discussed with Dr. Regenia Skeeter.   12:03 PM The D-dimer is elevated at 2.99, chest CT angiogram obtained demonstrated bilateral pulmonary artery embolism involving the distal main, lobar, segmental, and subsegmental branches with clot burden greater on the right.  Small left pleural effusion with adjacent atelectasis.  Mild elevated RV/LV ratio measuring 1.1   I have initiated heparin,  will consult for admission.  Patient made aware of plan.  12:53 PM Appreciate consultation from [facility] hospitalist, Dr. Lorin Mercy, who agrees to admit patient to [facility] for further care.  Patient is made aware of plan and agrees with plan.   Domenic Moras, PA-C 07/20/20 [place], MD 07/20/20 [phone]

## 2020-07-20 NOTE — Progress Notes (Signed)
Received a phone call from Facility: [facility]  Requesting MD: Domenic Moras, PA Patient with h/o DM presenting with progressive SOB, cough - not improving despite steroids, antibiotics.  CTA with B PE, mild R heart strain.  Sedentary lifestyle, +recent travel, +FH.  Given heparin. Plan of care: Needs admission for initiation of Heparin, Echo The patient will be accepted for admission to telemetry at [facility] when bed is available.     Nursing staff, Please call the [place] number at the top of Amion at the time of the patient's arrival so that the patient can be paged to the admitting physician.   Carlyon Shadow, M.D. [facility]

## 2020-07-20 NOTE — ED Notes (Signed)
Ambulated from triage to room 7, SPO2 89%, mild subjective DOE, HR 102-110.

## 2020-07-20 NOTE — Progress Notes (Signed)
ANTICOAGULATION CONSULT NOTE - Initial Consult  Pharmacy Consult for heparin Indication: pulmonary embolus  No Known Allergies  Patient Measurements: Height: 5\' 6"  (167.6 cm) Weight: 113.4 kg (250 lb) IBW/kg (Calculated) : 63.8 Heparin Dosing Weight: 89.8kg  Vital Signs: Temp: 98.3 F (36.8 C) (06/27 1020) Temp Source: Oral (06/27 1020) BP: 142/75 (06/27 1105) Pulse Rate: 95 (06/27 1105)  Labs: Recent Labs    07/20/20 1032  HGB 15.4  HCT 47.5  PLT 149*  CREATININE 0.97  TROPONINIHS 13    Estimated Creatinine Clearance: 93.4 mL/min (by C-G formula based on SCr of 0.97 mg/dL).   Medical History: Past Medical History:  Diagnosis Date   Atypical mole 03/26/2009   mild-left mid back   Diabetes mellitus without complication ([facility])     Medications:  Infusions:   heparin      Assessment: 89 yom presented to the ED with SOB. Found to have a bilateral PE and now starting IV heparin. Baseline Hgb is WNL but platelets are slightly low. He is not on anticoagulation PTA.   Goal of Therapy:  Heparin level 0.3-0.7 units/ml Monitor platelets by anticoagulation protocol: Yes   Plan:  Heparin bolus 5000 units IV x 1 Heparin gtt 1500 units/hr Check a 6 hr heparin level Daily heparin level and CBC  Byan Poplaski, Rande Lawman 07/20/2020,12:05 PM

## 2020-07-20 NOTE — H&P (Addendum)
History and Physical    Samuel Madden WUJ:[id number] DOB: 02/24/1955 DOA: 07/20/2020  PCP: Samuel Noon, MD Consultants:  dermatology: dr. Denna Madden  Patient coming from: [facility].  Home - lives with wife, Samuel Madden.   Chief Complaint: shortness of breath   HPI: Samuel Madden is a 65 y.o. male with medical history significant of prediabetes, HLD, OSA on CPAP, low testosterone and obesity  who presented to ED with shortness of breath. He states symptoms started at beginning of May with migrating rib pain. He tried a TENS unit and ibuprofen and pain subsided. He was then supposed to go on vacation on 06/01/20 and had some shortness of breath. He did a covid test and negative at home. Went on vacation and did [place] stuff, but was more winded with exertion/stairs. He came back from vacation and went to an UC and was given a steroid shot and round of abx. This helped for a little while. He saw his PCP on 5/24 and was given steroids and another round of abx. He finished his medication and states his symptoms started coming back with shortness of breath, chest pain.  He took a flight to [place] on 06/28/20 and states symptoms seemed to get worse on the flight mainly extreme pain behind his sternum and shortness of breath.  He had to use a WC to get to baggage claim. He took anotehr round of steorid and abx from his pcp. He felt much better after this. This past Friday he had sudden onset of cough, shortness of breath and the chest pressure. He checked oxygen and it was 85% and came to ER.   He has a very sedentary lifestyle. NO hx of smoking or SLE. NO trauma. He is on topical testosterone.  Denies any leg swelling. Endorses othopnea and dypsnea. Denies headache, vision changes, chest pain currently, stomach pain, N/V/D. No leg swelling or calf pain. NO hx of blood clotting.     ED Course: bp: 144/85, HR: 99, afebrile, RR: 26 and oxygen 91% RA. Labs significant for elevated d-dimer and CTA showed bilateral  pulmonary emboli clot burden greater on right. CXR left base collapse, consolidation with small to moderate left pleural effusion and cardiomegaly. Started on heparin drip and asked to admit for PE.   Review of Systems: As per HPI; otherwise review of systems reviewed and negative.   Ambulatory Status:  Ambulates without assistance  COVID Vaccine Status:  pfizer x 2 and booster   Past Medical History:  Diagnosis Date   Atypical mole 03/26/2009   mild-left mid back   Diabetes mellitus without complication ([place])     History reviewed. No pertinent surgical history.  Social History   Socioeconomic History   Marital status: Divorced    Spouse name: Not on file   Number of children: Not on file   Years of education: Not on file   Highest education level: Not on file  Occupational History   Not on file  Tobacco Use   Smoking status: Not on file   Smokeless tobacco: Not on file  Substance and Sexual Activity   Alcohol use: Not on file   Drug use: Not on file   Sexual activity: Not on file  Other Topics Concern   Not on file  Social History Narrative   Not on file   Social Determinants of Health   Financial Resource Strain: Not on file  Food Insecurity: Not on file  Transportation Needs: Not on file  Physical Activity:  Not on file  Stress: Not on file  Social Connections: Not on file  Intimate Partner Violence: Not on file    No Known Allergies  No family history on file.  Prior to Admission medications   Medication Sig Start Date End Date Taking? Authorizing Provider  Aspirin Buf,CaCarb-MgCarb-MgO, 81 MG TABS Take by mouth.    [provider]  minocycline (MINOCIN) 50 MG capsule TAKE ONE CAPSULE BY MOUTH TWICE A DAY 05/18/20   Lavonna Monarch, MD  rosuvastatin (CRESTOR) 20 MG tablet Take 1 tablet by mouth daily. 10/16/17   [provider]  tadalafil (CIALIS) 5 MG tablet Take 1 tablet by mouth daily. 11/25/17   [provider]  Testosterone 30  MG/ACT SOLN Place onto the skin. 10/16/17   [provider]    Physical Exam: Vitals:   07/20/20 1230 07/20/20 1530 07/20/20 1700 07/20/20 1707  BP: 117/72 129/81  125/79  Pulse: 85 85  93  Resp: (!) 28 (!) 27  20  Temp:    99 F (37.2 C)  TempSrc:      SpO2: 96% 96%  98%  Weight:   114.4 kg   Height:   5\' 6"  (1.676 m)      General:  Appears calm and comfortable and is in NAD Eyes:  PERRL, EOMI, normal lids, iris ENT:  grossly normal hearing, lips & tongue, mmm; appropriate dentition Neck:  no LAD, masses or thyromegaly; no carotid bruits Cardiovascular:  RRR, no m/r/g. No LE edema.  Respiratory:   LLL crackles. Normal respiratory effort. No increased work of breathing.   Normal respiratory effort. Abdomen:  soft, NT, ND, NABS Back:   normal alignment, no CVAT Skin:  no rash or induration seen on limited exam Musculoskeletal:  grossly normal tone BUE/BLE, good ROM, no bony abnormality Lower extremity:  No LE edema. Negative homan sign bilaterally. No increased calf circumference.  Limited foot exam with no ulcerations.  2+ distal pulses. Psychiatric:  grossly normal mood and affect, speech fluent and appropriate, AOx3 Neurologic:  CN 2-12 grossly intact, moves all extremities in coordinated fashion, sensation intact    Radiological Exams on Admission: Independently reviewed - see discussion in A/P where applicable  CT Angio Chest PE W and/or Wo Contrast  Result Date: 07/20/2020 CLINICAL DATA:  Hyper bubble ED pulmonary embolism Shortness of breath Rib and sternal pain Recent airplane travel EXAM: CT ANGIOGRAPHY CHEST WITH CONTRAST TECHNIQUE: Multidetector CT imaging of the chest was performed using the standard protocol during bolus administration of intravenous contrast. Multiplanar CT image reconstructions and MIPs were obtained to evaluate the vascular anatomy. CONTRAST:  142mL OMNIPAQUE IOHEXOL 350 MG/ML SOLN COMPARISON:  None. FINDINGS: Cardiovascular: Heart size  at upper limits of normal. Mediastinal lipomatosis. Bilateral pulmonary artery emboli involving distal main, lobar, segmental, and subsegmental branches. Burden of clot is greater on the right. Mildly elevated RV/LV ratio measuring 1.1. Mediastinum/Nodes: No enlarged mediastinal axillary, supraclavicular, or hilar lymph nodes. Lungs/Pleura: Scattered calcified granulomas seen bilaterally.Small left pleural effusion is present with adjacent atelectasis. Upper Abdomen: No acute abnormality. Musculoskeletal: No chest wall abnormality. No acute or significant osseous findings. Review of the MIP images confirms the above findings. IMPRESSION: 1. Bilateral pulmonary artery embolism involving distal main, lobar, segmental and subsegmental branches. Clot burden is greater on the right. 2. Small left pleural effusion with adjacent atelectasis. 3. Mildly elevated RV/LV ratio measuring 1.1. Electronically Signed   By: Miachel Roux M.D.   On: 07/20/2020 11:50   DG Chest  Portable 1 View  Result Date: 07/20/2020 CLINICAL DATA:  Shortness of breath. EXAM: PORTABLE CHEST 1 VIEW COMPARISON:  None. FINDINGS: 1040 hours. Left base collapse/consolidation with small to moderate left pleural effusion. No focal airspace consolidation or substantial effusion in the right lung. The cardio pericardial silhouette is enlarged. There is pulmonary vascular congestion without overt pulmonary edema. The visualized bony structures of the thorax show no acute abnormality. Telemetry leads overlie the chest. IMPRESSION: Left base collapse/consolidation with small to moderate left pleural effusion. Cardiomegaly with vascular congestion. Electronically Signed   By: Misty Stanley M.D.   On: 07/20/2020 11:10    EKG: Independently reviewed.  NSR with rate 99; nonspecific ST changes with no evidence of acute ischemia. No previous tracings.    Labs on Admission: I have personally reviewed the available labs and imaging studies at the time of the  admission.  Pertinent labs:  Troponin x2: normal  Covid negative  D-dimer: 2.99 CXR; left base collapse, consolidation with small to moderate left pleural effusion  Cardiomegaly   Assessment/Plan Principal Problem:   Bilateral pulmonary embolism (HCC) -heparin drip per pharmacy -echo and BNP pending to assess heart strain -CXR with left pleural effusion and cardiomegaly-echo pending  -continue tele -? Provoked with sedentary lifestyle/air travel? -holding topical testosterone, but no evidence of erythrocytosis on cbc.    Active Problems:   Hyperlipidemia -continue home medication.     Prediabetes -a1c pending -requesting regular diet which I will do until a1c results.  -encouraged a low carb diet regardless.     OSA on CPAP  -has own cpap. Written for this with home settings.   Folliculitis Continue minocycline.   Body mass index is 40.69 kg/m.  Note: This patient has been tested and is negative for the novel coronavirus COVID-19. The patient has been fully vaccinated against COVID-19.   Level of care: Telemetry Medical DVT prophylaxis: heparin infusion   Code Status:  Full - confirmed with patient Family Communication: Wife, Samuel Madden in the room.   Disposition Plan:  The patient is from: home  Anticipated d/c is to: home once testing completed, cardiac issues sorted.  Consults called: none   Admission status:  inpatient    Orma Flaming MD [facility]   How to contact the [facility] Attending or Consulting provider [place] or covering provider during after hours [place], for this patient?  Check the care team in [facility] and look for a) attending/consulting [facility] provider listed and b) the [facility] team listed Log into [email or web address] and use [place]'s universal password to access. If you do not have the password, please contact the hospital operator. Locate the [facility] provider you are looking for under [facility] and page to a number that you can be directly  reached. If you still have difficulty reaching the provider, please page the [facility] (Director on Call) for the Hospitalists listed on amion for assistance.   07/20/2020, 6:57 PM

## 2020-07-20 NOTE — Progress Notes (Signed)
Patients home CPAP machine set up by patient and sterile water added per patient request. CPAP is at bedside within patients reach. Patient stated he would place on himself when ready and didn't need anymore assistance.

## 2020-07-20 NOTE — Progress Notes (Signed)
ANTICOAGULATION CONSULT NOTE - Initial Consult  Pharmacy Consult for heparin Indication: pulmonary embolus 07/20/20  No Known Allergies  Patient Measurements: Height: 5\' 6"  (167.6 cm) Weight: 114.4 kg (252 lb 1.6 oz) IBW/kg (Calculated) : 63.8 Heparin Dosing Weight: 90kg  Vital Signs: Temp: 99 F (37.2 C) (06/27 1707) Temp Source: Oral (06/27 1020) BP: 125/79 (06/27 1707) Pulse Rate: 93 (06/27 1707)  Labs: Recent Labs    07/20/20 1032 07/20/20 1259  HGB 15.4  --   HCT 47.5  --   PLT 149*  --   CREATININE 0.97  --   TROPONINIHS 13 15    Estimated Creatinine Clearance: 93.8 mL/min (by C-G formula based on SCr of 0.97 mg/dL).    Assessment: 65 yo M found to have acute bilateral PE 07/20/20, higher clot burden on right, mild RHS. No anticoagulation prior to admission. Pharmacy consulted for heparin.    First HL 0.11 subtherapeutic drawn 8 hours after bolus.  No issues with infusion or bleeding per RN.   Goal of Therapy:  Heparin level 0.3-0.7 units/ml Monitor platelets by anticoagulation protocol: Yes   Plan:  Give another 4000 unit bolus and increase heparin gtt to 1750 units/hr Monitor daily HL, CBC/plt Monitor for signs/symptoms of bleeding  F/u long term plan    Benetta Spar, PharmD, BCPS, [place] Pharmacist  Please check AMION for all [place] phone numbers After 10:00 PM, call [place]

## 2020-07-20 NOTE — Progress Notes (Signed)
07/20/2020 Patient transfer from [facility] to [place]. He is alert, oriented to person, place, time and situation. He was placed on telemetry and central monitor was made aware. [facility] RN.

## 2020-07-20 NOTE — ED Triage Notes (Signed)
Pt has been having some sob since may.  Pt has taken antibiotics, steroids and chest xrays and was diagnosed with bronchitis.  Pt has productive cough, clear with blood specs.  Pt admits to having calf pain in May prior to sob as well as recent travel.  Pt admits to right rib pain.

## 2020-07-21 ENCOUNTER — Other Ambulatory Visit (HOSPITAL_COMMUNITY): Payer: Self-pay

## 2020-07-21 ENCOUNTER — Inpatient Hospital Stay (HOSPITAL_COMMUNITY): Payer: BC Managed Care – PPO

## 2020-07-21 DIAGNOSIS — I2602 Saddle embolus of pulmonary artery with acute cor pulmonale: Secondary | ICD-10-CM

## 2020-07-21 LAB — CBC
HCT: 48.3 % (ref 39.0–52.0)
Hemoglobin: 15.8 g/dL (ref 13.0–17.0)
MCH: 28.5 pg (ref 26.0–34.0)
MCHC: 32.7 g/dL (ref 30.0–36.0)
MCV: 87 fL (ref 80.0–100.0)
Platelets: 155 10*3/uL (ref 150–400)
RBC: 5.55 MIL/uL (ref 4.22–5.81)
RDW: 14.2 % (ref 11.5–15.5)
WBC: 7.8 10*3/uL (ref 4.0–10.5)
nRBC: 0 % (ref 0.0–0.2)

## 2020-07-21 LAB — HEPARIN LEVEL (UNFRACTIONATED)
Heparin Unfractionated: 0.14 IU/mL — ABNORMAL LOW (ref 0.30–0.70)
Heparin Unfractionated: 0.27 IU/mL — ABNORMAL LOW (ref 0.30–0.70)
Heparin Unfractionated: 0.32 IU/mL (ref 0.30–0.70)

## 2020-07-21 LAB — ECHOCARDIOGRAM COMPLETE
Area-P 1/2: 2.81 cm2
Height: 66 in
S' Lateral: 3.1 cm
Weight: 4033.6 oz

## 2020-07-21 MED ORDER — HEPARIN BOLUS VIA INFUSION
3000.0000 [IU] | Freq: Once | INTRAVENOUS | Status: AC
  Administered 2020-07-21: 3000 [IU] via INTRAVENOUS
  Filled 2020-07-21: qty 3000

## 2020-07-21 MED ORDER — HEPARIN (PORCINE) 25000 UT/250ML-% IV SOLN
2250.0000 [IU]/h | INTRAVENOUS | Status: DC
  Administered 2020-07-21: 2250 [IU]/h via INTRAVENOUS
  Administered 2020-07-21 (×2): 2050 [IU]/h via INTRAVENOUS
  Administered 2020-07-22 (×2): 2250 [IU]/h via INTRAVENOUS
  Filled 2020-07-21 (×4): qty 250

## 2020-07-21 NOTE — Progress Notes (Signed)
ANTICOAGULATION CONSULT NOTE  Pharmacy Consult for heparin Indication: pulmonary embolus 07/20/20  No Known Allergies  Patient Measurements: Height: 5\' 6"  (167.6 cm) Weight: 114.4 kg (252 lb 1.6 oz) IBW/kg (Calculated) : 63.8 Heparin Dosing Weight: 90kg  Vital Signs: Temp: 98.6 F (37 C) (06/27 2132) BP: 124/66 (06/27 2132) Pulse Rate: 90 (06/27 2132)  Labs: Recent Labs    07/20/20 1032 07/20/20 1259 07/20/20 1842 07/21/20 0256  HGB 15.4  --   --  15.8  HCT 47.5  --   --  48.3  PLT 149*  --   --  155  HEPARINUNFRC  --   --  0.11* 0.14*  CREATININE 0.97  --   --   --   TROPONINIHS 13 15  --   --     Estimated Creatinine Clearance: 93.8 mL/min (by C-G formula based on SCr of 0.97 mg/dL).    Assessment: 65 yo M found to have acute bilateral PE 07/20/20, higher clot burden on right, mild RHS. No anticoagulation prior to admission. Pharmacy consulted for heparin.    Heparin level 0.14 (subtherapeutic) on gtt at 1750 units/hr. No issues with infusion or bleeding per RN.   Goal of Therapy:  Heparin level 0.3-0.7 units/ml Monitor platelets by anticoagulation protocol: Yes   Plan:  Rebolus heparin 3000 units and increase heparin gtt to 2050 units/hr Will f/u 6 hr heparin level  Sherlon Handing, PharmD, BCPS Please see amion for complete clinical pharmacist phone list 07/21/2020 4:37 AM

## 2020-07-21 NOTE — Progress Notes (Signed)
ANTICOAGULATION CONSULT NOTE  Pharmacy Consult for heparin Indication: pulmonary embolus 07/20/20  No Known Allergies  Patient Measurements: Height: 5\' 6"  (167.6 cm) Weight: 114.4 kg (252 lb 1.6 oz) IBW/kg (Calculated) : 63.8 Heparin Dosing Weight: 90kg  Vital Signs: Temp: 98 F (36.7 C) (06/28 1306) Temp Source: Oral (06/28 1306) BP: 115/84 (06/28 1306) Pulse Rate: 90 (06/28 1306)  Labs: Recent Labs    07/20/20 1032 07/20/20 1259 07/20/20 1842 07/21/20 0256 07/21/20 1121  HGB 15.4  --   --  15.8  --   HCT 47.5  --   --  48.3  --   PLT 149*  --   --  155  --   HEPARINUNFRC  --   --  0.11* 0.14* 0.27*  CREATININE 0.97  --   --   --   --   TROPONINIHS 13 15  --   --   --     Estimated Creatinine Clearance: 93.8 mL/min (by C-G formula based on SCr of 0.97 mg/dL).    Assessment: 65 yo M found to have acute bilateral PE 07/20/20, higher clot burden on right, mild RHS. No anticoagulation prior to admission. Pharmacy consulted for heparin.    Heparin level 0.27 this afternoon  Goal of Therapy:  Heparin level 0.3-0.7 units/ml Monitor platelets by anticoagulation protocol: Yes   Plan:  Increase heparin to 2250 units / hr Check a 6 hour level to ensure therapeutic  Thank you Anette Guarneri, PharmD  07/21/2020 1:19 PM

## 2020-07-21 NOTE — TOC Benefit Eligibility Note (Signed)
Patient Advocate Encounter  Prior Authorization for Eliquis 5 mg has been approved.    PA# [id number] Effective dates: 07/21/2020 through 07/21/2021  Patients co-pay is $30.00.     [name], [place] Patient Advocate Specialist [place] Antimicrobial Stewardship Team Direct Number: [phone]  Fax: [phone]

## 2020-07-21 NOTE — Progress Notes (Signed)
Echocardiogram 2D Echocardiogram has been performed.  Oneal Deputy Nidhi Jacome RDCS 07/21/2020, 10:32 AM

## 2020-07-21 NOTE — Plan of Care (Signed)

## 2020-07-21 NOTE — TOC Benefit Eligibility Note (Signed)
Patient Teacher, English as a foreign language completed.    The patient is currently admitted and upon discharge could be taking Eliquis 5 mg.  Requires Prior Authorization  The patient is insured through [place], [place] Patient Advocate Specialist [place] Team Direct Number: [phone]  Fax: [phone]

## 2020-07-21 NOTE — Progress Notes (Signed)
PROGRESS NOTE    SRIHARI SHELLHAMMER  NKN:[id number] DOB: 04/06/1955 DOA: 07/20/2020 PCP: Chesley Noon, MD  [phone]   Assessment & Plan:   Principal Problem:   Bilateral pulmonary embolism ([place]) Active Problems:   Hyperlipidemia   Prediabetes   OSA on CPAP   Harkirat K Laureano is a 65 y.o. male with medical history significant of prediabetes, HLD, OSA on CPAP, low testosterone and obesity  who presented to ED with shortness of breath. He states symptoms started at beginning of May with migrating rib pain. He tried a TENS unit and ibuprofen and pain subsided. He was then supposed to go on vacation on 06/01/20 and had some shortness of breath. He did a covid test and negative at home. Went on vacation and did [place]aml stuff, but was more winded with exertion/stairs. He came back from vacation and went to an UC and was given a steroid shot and round of abx. This helped for a little while. He saw his PCP on 5/24 and was given steroids and another round of abx. He finished his medication and states his symptoms started coming back with shortness of breath, chest pain.  He took a flight to [place] on 06/28/20 and states symptoms seemed to get worse on the flight mainly extreme pain behind his sternum and shortness of breath.  He had to use a WC to get to baggage claim. He took anotehr round of steorid and abx from his pcp. He felt much better after this. This past Friday he had sudden onset of cough, shortness of breath and the chest pressure. He checked oxygen and it was 85% and came to ER.   Bilateral pulmonary embolism (HCC) RV dysfunction  -symptoms started about 2 months ago, received 3 courses of abx and steroid with no improvement. --started on heparin drip on admission. --Echo showed RV dysfunction, discussed with cardio, can be resulted by long-standing significant PE burden. Plan: --cont heparin gtt, will continue for at least 48 hours given significant clot burden and RV  dysfunction. --will transition to Xarelto prior to discharge. --will need repeat Echo as outpatient    Hyperlipidemia --cont home statin     Prediabetes --a1c 6.2 --no need for SSI     OSA on CPAP --cont own CPAP nigghtly   Folliculitis --cont home minocycline  Cough --present for about 2 months, with some congestion.  Improved while on steroids, but returned when steroid course done. --hold further steroids --supportive care with Robitussin   DVT prophylaxis: GD:[id number] gtt Code Status: Full code  Family Communication: wife updated on the phone today Level of care: Telemetry Medical Dispo:   The patient is from: home Anticipated d/c is to: home Anticipated d/c date is: in 2 days Patient currently is not medically ready to d/c due to: need at least 48 hours of heparin gtt due to significant PE and RV dysfunction.   Subjective and Interval History:  Pt complained of cough, dyspnea, and mild chest pain.   Objective: Vitals:   07/20/20 2132 07/21/20 0618 07/21/20 1306 07/21/20 1953  BP: 124/66 118/80 115/84 122/77  Pulse: 90 97 90 100  Resp: 16 16 18 20   Temp: 98.6 F (37 C) 99 F (37.2 C) 98 F (36.7 C) 98.1 F (36.7 C)  TempSrc:   Oral Oral  SpO2: 94% 91% 97% 91%  Weight:      Height:        Intake/Output Summary (Last 24 hours) at 07/22/2020 0243 Last data filed at  07/21/2020 1619 Gross per 24 hour  Intake 215.18 ml  Output --  Net 215.18 ml   Filed Weights   07/20/20 1022 07/20/20 1700  Weight: 113.4 kg 114.4 kg    Examination:   Constitutional: NAD, AAOx3 HEENT: conjunctivae and lids normal, EOMI CV: No cyanosis.   RESP: normal respiratory effort, mild crackles over posterior left base, on 2L Extremities: No effusions, edema in BLE SKIN: warm, dry Neuro: II - XII grossly intact.   Psych: Normal mood and affect.  Appropriate judgement and reason   Data Reviewed: I have personally reviewed following labs and imaging studies  CBC: Recent  Labs  Lab 07/20/20 1032 07/21/20 0256  WBC 7.4 7.8  HGB 15.4 15.8  HCT 47.5 48.3  MCV 88.3 87.0  PLT 149* 235   Basic Metabolic Panel: Recent Labs  Lab 07/20/20 1032  NA 137  K 4.0  CL 102  CO2 25  GLUCOSE 144*  BUN 12  CREATININE 0.97  CALCIUM 8.9   GFR: Estimated Creatinine Clearance: 93.8 mL/min (by C-G formula based on SCr of 0.97 mg/dL). Liver Function Tests: No results for input(s): AST, ALT, ALKPHOS, BILITOT, PROT, ALBUMIN in the last 168 hours. No results for input(s): LIPASE, AMYLASE in the last 168 hours. No results for input(s): AMMONIA in the last 168 hours. Coagulation Profile: No results for input(s): INR, PROTIME in the last 168 hours. Cardiac Enzymes: No results for input(s): CKTOTAL, CKMB, CKMBINDEX, TROPONINI in the last 168 hours. BNP (last 3 results) No results for input(s): PROBNP in the last 8760 hours. HbA1C: Recent Labs    07/21/20 0256  HGBA1C 6.2*   CBG: Recent Labs  Lab 07/20/20 1707  GLUCAP 107*   Lipid Profile: No results for input(s): CHOL, HDL, LDLCALC, TRIG, CHOLHDL, LDLDIRECT in the last 72 hours. Thyroid Function Tests: No results for input(s): TSH, T4TOTAL, FREET4, T3FREE, THYROIDAB in the last 72 hours. Anemia Panel: No results for input(s): VITAMINB12, FOLATE, FERRITIN, TIBC, IRON, RETICCTPCT in the last 72 hours. Sepsis Labs: No results for input(s): PROCALCITON, LATICACIDVEN in the last 168 hours.  Recent Results (from the past 240 hour(s))  Resp Panel by RT-PCR (Flu A&B, Covid) Nasopharyngeal Swab     Status: None   Collection Time: 07/20/20 10:58 AM   Specimen: Nasopharyngeal Swab; Nasopharyngeal(NP) swabs in vial transport medium  Result Value Ref Range Status   SARS Coronavirus 2 by RT PCR NEGATIVE NEGATIVE Final    Comment: (NOTE) SARS-CoV-2 target nucleic acids are NOT DETECTED.  The SARS-CoV-2 RNA is generally detectable in upper respiratory specimens during the acute phase of infection. The  lowest concentration of SARS-CoV-2 viral copies this assay can detect is 138 copies/mL. A negative result does not preclude SARS-Cov-2 infection and should not be used as the sole basis for treatment or other patient management decisions. A negative result may occur with  improper specimen collection/handling, submission of specimen other than nasopharyngeal swab, presence of viral mutation(s) within the areas targeted by this assay, and inadequate number of viral copies(<138 copies/mL). A negative result must be combined with clinical observations, patient history, and epidemiological information. The expected result is Negative.  Fact Sheet for Patients:  EntrepreneurPulse.com.au  Fact Sheet for Healthcare Providers:  IncredibleEmployment.be  This test is no t yet approved or cleared by the [place] FDA and  has been authorized for detection and/or diagnosis of SARS-CoV-2 by FDA under an Emergency Use Authorization (EUA). This EUA will remain  in effect (meaning this test can be  used) for the duration of the COVID-19 declaration under Section 564(b)(1) of the Act, 21 U.S.C.section 360bbb-3(b)(1), unless the authorization is terminated  or revoked sooner.       Influenza A by PCR NEGATIVE NEGATIVE Final   Influenza B by PCR NEGATIVE NEGATIVE Final    Comment: (NOTE) The Xpert Xpress SARS-CoV-2/FLU/RSV plus assay is intended as an aid in the diagnosis of influenza from Nasopharyngeal swab specimens and should not be used as a sole basis for treatment. Nasal washings and aspirates are unacceptable for Xpert Xpress SARS-CoV-2/FLU/RSV testing.  Fact Sheet for Patients: EntrepreneurPulse.com.au  Fact Sheet for Healthcare Providers: IncredibleEmployment.be  This test is not yet approved or cleared by the [place] FDA and has been authorized for detection and/or diagnosis of SARS-CoV-2 by FDA under  an Emergency Use Authorization (EUA). This EUA will remain in effect (meaning this test can be used) for the duration of the COVID-19 declaration under Section 564(b)(1) of the Act, 21 U.S.C. section 360bbb-3(b)(1), unless the authorization is terminated or revoked.  Performed at [facility], [address] [place], [place] [address]       Radiology Studies: CT Angio Chest PE W and/or Wo Contrast  Result Date: 07/20/2020 CLINICAL DATA:  Hyper bubble ED pulmonary embolism Shortness of breath Rib and sternal pain Recent airplane travel EXAM: CT ANGIOGRAPHY CHEST WITH CONTRAST TECHNIQUE: Multidetector CT imaging of the chest was performed using the standard protocol during bolus administration of intravenous contrast. Multiplanar CT image reconstructions and MIPs were obtained to evaluate the vascular anatomy. CONTRAST:  189mL OMNIPAQUE IOHEXOL 350 MG/ML SOLN COMPARISON:  None. FINDINGS: Cardiovascular: Heart size at upper limits of normal. Mediastinal lipomatosis. Bilateral pulmonary artery emboli involving distal main, lobar, segmental, and subsegmental branches. Burden of clot is greater on the right. Mildly elevated RV/LV ratio measuring 1.1. Mediastinum/Nodes: No enlarged mediastinal axillary, supraclavicular, or hilar lymph nodes. Lungs/Pleura: Scattered calcified granulomas seen bilaterally.Small left pleural effusion is present with adjacent atelectasis. Upper Abdomen: No acute abnormality. Musculoskeletal: No chest wall abnormality. No acute or significant osseous findings. Review of the MIP images confirms the above findings. IMPRESSION: 1. Bilateral pulmonary artery embolism involving distal main, lobar, segmental and subsegmental branches. Clot burden is greater on the right. 2. Small left pleural effusion with adjacent atelectasis. 3. Mildly elevated RV/LV ratio measuring 1.1. Electronically Signed   By: Miachel Roux M.D.   On: 07/20/2020 11:50   DG Chest Portable 1  View  Result Date: 07/20/2020 CLINICAL DATA:  Shortness of breath. EXAM: PORTABLE CHEST 1 VIEW COMPARISON:  None. FINDINGS: 1040 hours. Left base collapse/consolidation with small to moderate left pleural effusion. No focal airspace consolidation or substantial effusion in the right lung. The cardio pericardial silhouette is enlarged. There is pulmonary vascular congestion without overt pulmonary edema. The visualized bony structures of the thorax show no acute abnormality. Telemetry leads overlie the chest. IMPRESSION: Left base collapse/consolidation with small to moderate left pleural effusion. Cardiomegaly with vascular congestion. Electronically Signed   By: Misty Stanley M.D.   On: 07/20/2020 11:10   ECHOCARDIOGRAM COMPLETE  Result Date: 07/21/2020    ECHOCARDIOGRAM REPORT   Patient Name:   AUGIE VANE Date of Exam: 07/21/2020 Medical Rec #:  [id number]        Height:       66.0 in Accession #:    [id number]       Weight:       252.1 lb Date of Birth:  [birth year]  BSA:          2.207 m Patient Age:    29 years         BP:           118/80 mmHg Patient Gender: M                HR:           95 bpm. Exam Location:  Inpatient Procedure: 2D Echo, 3D Echo, Color Doppler, Cardiac Doppler and Strain Analysis Indications:    I26.02 Pulmonary embolus  History:        Patient has no prior history of Echocardiogram examinations.                 Risk Factors:Diabetes, Dyslipidemia and Sleep Apnea.  Sonographer:    Raquel Sarna Senior RDCS Referring Phys: [id number] [place]  1. Left ventricular ejection fraction, by estimation, is 55 to 60%. The left ventricle has normal function. The left ventricle has no regional wall motion abnormalities. Left ventricular diastolic parameters are consistent with Grade I diastolic dysfunction (impaired relaxation). There is the interventricular septum is flattened in systole and diastole, consistent with right ventricular pressure and volume overload.  2. 3D echo  calculated RV EF is 33%. RV systolic pressure is likely underestimated due to weak TR jet. Right ventricular systolic function is moderately reduced. The right ventricular size is severely enlarged. There is normal pulmonary artery systolic pressure.  3. Right atrial size was mildly dilated.  4. A small pericardial effusion is present. The pericardial effusion is circumferential. There is no evidence of cardiac tamponade.  5. The mitral valve is normal in structure. No evidence of mitral valve regurgitation.  6. The aortic valve is tricuspid. Aortic valve regurgitation is trivial. Mild aortic valve sclerosis is present, with no evidence of aortic valve stenosis.  7. There is mild dilatation of the ascending aorta, measuring 38 mm.  8. The inferior vena cava is dilated in size with <50% respiratory variability, suggesting right atrial pressure of 15 mmHg. FINDINGS  Left Ventricle: Left ventricular ejection fraction, by estimation, is 55 to 60%. The left ventricle has normal function. The left ventricle has no regional wall motion abnormalities. The left ventricular internal cavity size was normal in size. There is  borderline concentric left ventricular hypertrophy. The interventricular septum is flattened in systole and diastole, consistent with right ventricular pressure and volume overload. Left ventricular diastolic parameters are consistent with Grade I diastolic dysfunction (impaired relaxation). Normal left ventricular filling pressure. Right Ventricle: 3D echo calculated RV EF is 33%. RV systolic pressure is likely underestimated due to weak TR jet. The right ventricular size is severely enlarged. No increase in right ventricular wall thickness. Right ventricular systolic function is moderately reduced. There is normal pulmonary artery systolic pressure. The tricuspid regurgitant velocity is 2.26 m/s, and with an assumed right atrial pressure of 15 mmHg, the estimated right ventricular systolic pressure is  30.8 mmHg. Left Atrium: Left atrial size was normal in size. Right Atrium: Right atrial size was mildly dilated. Pericardium: A small pericardial effusion is present. The pericardial effusion is circumferential. There is no evidence of cardiac tamponade. Mitral Valve: The mitral valve is normal in structure. No evidence of mitral valve regurgitation. Tricuspid Valve: The tricuspid valve is normal in structure. Tricuspid valve regurgitation is trivial. Aortic Valve: The aortic valve is tricuspid. Aortic valve regurgitation is trivial. Mild aortic valve sclerosis is present, with no evidence of aortic valve stenosis. Pulmonic Valve: The  pulmonic valve was normal in structure. Pulmonic valve regurgitation is trivial. Aorta: The aortic root is normal in size and structure. There is mild dilatation of the ascending aorta, measuring 38 mm. Venous: The inferior vena cava is dilated in size with less than 50% respiratory variability, suggesting right atrial pressure of 15 mmHg. IAS/Shunts: No atrial level shunt detected by color flow Doppler.  LEFT VENTRICLE PLAX 2D LVIDd:         4.40 cm  Diastology LVIDs:         3.10 cm  LV e' medial:    8.49 cm/s LV PW:         1.20 cm  LV E/e' medial:  7.0 LV IVS:        0.90 cm  LV e' lateral:   5.33 cm/s LVOT diam:     1.90 cm  LV E/e' lateral: 11.2 LV SV:         49 LV SV Index:   22 LVOT Area:     2.84 cm  RIGHT VENTRICLE RV S prime:     10.90 cm/s TAPSE (M-mode): 1.9 cm LEFT ATRIUM             Index       RIGHT ATRIUM           Index LA diam:        2.80 cm 1.27 cm/m  RA Area:     15.50 cm LA Vol (A2C):   31.1 ml 14.09 ml/m RA Volume:   39.50 ml  17.90 ml/m LA Vol (A4C):   15.2 ml 6.89 ml/m LA Biplane Vol: 21.8 ml 9.88 ml/m  AORTIC VALVE LVOT Vmax:   138.00 cm/s LVOT Vmean:  95.200 cm/s LVOT VTI:    0.174 m  AORTA Ao Root diam: 3.30 cm Ao Asc diam:  3.80 cm MITRAL VALVE               TRICUSPID VALVE MV Area (PHT): 2.81 cm    TR Peak grad:   20.4 mmHg MV Decel Time: 270  msec    TR Vmax:        226.00 cm/s MV E velocity: 59.70 cm/s MV A velocity: 93.30 cm/s  SHUNTS MV E/A ratio:  0.64        Systemic VTI:  0.17 m                            Systemic Diam: 1.90 cm Mihai Croitoru MD Electronically signed by Sanda Klein MD Signature Date/Time: 07/21/2020/2:16:40 PM    Final      Scheduled Meds:  minocycline  50 mg Oral Daily   rosuvastatin  20 mg Oral Daily   sodium chloride flush  3 mL Intravenous Q12H   tadalafil  5 mg Oral Daily   Continuous Infusions:  sodium chloride     heparin 2,250 Units/hr (07/21/20 2301)     LOS: 2 days     Enzo Bi, MD [facility] If 7PM-7AM, please contact night-coverage 07/22/2020, 2:43 AM

## 2020-07-22 ENCOUNTER — Other Ambulatory Visit (HOSPITAL_COMMUNITY): Payer: Self-pay

## 2020-07-22 LAB — BASIC METABOLIC PANEL
Anion gap: 8 (ref 5–15)
BUN: 14 mg/dL (ref 8–23)
CO2: 24 mmol/L (ref 22–32)
Calcium: 9 mg/dL (ref 8.9–10.3)
Chloride: 101 mmol/L (ref 98–111)
Creatinine, Ser: 0.9 mg/dL (ref 0.61–1.24)
GFR, Estimated: >60 mL/min (ref >60)
Glucose, Bld: 160 mg/dL — ABNORMAL HIGH (ref 70–99)
Potassium: 4 mmol/L (ref 3.5–5.1)
Sodium: 133 mmol/L — ABNORMAL LOW (ref 135–145)

## 2020-07-22 LAB — CBC
HCT: 46.2 % (ref 39.0–52.0)
Hemoglobin: 14.9 g/dL (ref 13.0–17.0)
MCH: 28.2 pg (ref 26.0–34.0)
MCHC: 32.3 g/dL (ref 30.0–36.0)
MCV: 87.5 fL (ref 80.0–100.0)
Platelets: 163 10*3/uL (ref 150–400)
RBC: 5.28 MIL/uL (ref 4.22–5.81)
RDW: 14.3 % (ref 11.5–15.5)
WBC: 7.3 10*3/uL (ref 4.0–10.5)
nRBC: 0 % (ref 0.0–0.2)

## 2020-07-22 LAB — HEPARIN LEVEL (UNFRACTIONATED): Heparin Unfractionated: 0.35 IU/mL (ref 0.30–0.70)

## 2020-07-22 LAB — HEMOGLOBIN A1C
Hgb A1c MFr Bld: 6.2 % — ABNORMAL HIGH (ref 4.8–5.6)
Mean Plasma Glucose: 131 mg/dL

## 2020-07-22 LAB — MAGNESIUM: Magnesium: 2.1 mg/dL (ref 1.7–2.4)

## 2020-07-22 MED ORDER — FUROSEMIDE 10 MG/ML IJ SOLN
40.0000 mg | Freq: Once | INTRAMUSCULAR | Status: AC
  Administered 2020-07-22: 40 mg via INTRAVENOUS
  Filled 2020-07-22: qty 4

## 2020-07-22 NOTE — Progress Notes (Signed)
ANTICOAGULATION CONSULT NOTE  Pharmacy Consult for heparin Indication: pulmonary embolus 07/20/20  No Known Allergies  Patient Measurements: Height: 5\' 6"  (167.6 cm) Weight: 114.4 kg (252 lb 1.6 oz) IBW/kg (Calculated) : 63.8 Heparin Dosing Weight: 90kg  Vital Signs: Temp: 98.2 F (36.8 C) (06/29 0510) Temp Source: Oral (06/29 0510) BP: 124/94 (06/29 0510) Pulse Rate: 94 (06/29 0510)  Labs: Recent Labs    07/20/20 1032 07/20/20 1259 07/20/20 1842 07/21/20 0256 07/21/20 1121 07/21/20 2136 07/22/20 0717  HGB 15.4  --   --  15.8  --   --  14.9  HCT 47.5  --   --  48.3  --   --  46.2  PLT 149*  --   --  155  --   --  163  HEPARINUNFRC  --   --    < > 0.14* 0.27* 0.32 0.35  CREATININE 0.97  --   --   --   --   --   --   TROPONINIHS 13 15  --   --   --   --   --    < > = values in this interval not displayed.    Estimated Creatinine Clearance: 93.8 mL/min (by C-G formula based on SCr of 0.97 mg/dL).    Assessment: 65 yo M found to have acute bilateral PE 07/20/20, higher clot burden on right, mild RHS. No anticoagulation prior to admission. Pharmacy consulted for heparin.    Heparin level now therapeutic x 2  Goal of Therapy:  Heparin level 0.3-0.7 units/ml Monitor platelets by anticoagulation protocol: Yes   Plan:  Continue heparin at 2250 units / hr Daily heparin level, CBC  Thank you Anette Guarneri, PharmD  07/22/2020 8:07 AM

## 2020-07-22 NOTE — Progress Notes (Signed)
PROGRESS NOTE    Samuel Madden  NGE:[id number] DOB: [birth year] DOA: 07/20/2020 PCP: Chesley Noon, MD  [phone]   Assessment & Plan:   Principal Problem:   Bilateral pulmonary embolism ([place]) Active Problems:   Hyperlipidemia   Prediabetes   OSA on CPAP   Samuel Madden is a 65 y.o. male with medical history significant of prediabetes, HLD, OSA on CPAP, low testosterone and obesity  who presented to ED with shortness of breath. He states symptoms started at beginning of May with migrating rib pain. He tried a TENS unit and ibuprofen and pain subsided. He was then supposed to go on vacation on 06/01/20 and had some shortness of breath. He did a covid test and negative at home. Went on vacation and did noraml stuff, but was more winded with exertion/stairs. He came back from vacation and went to an UC and was given a steroid shot and round of abx. This helped for a little while. He saw his PCP on 5/24 and was given steroids and another round of abx. He finished his medication and states his symptoms started coming back with shortness of breath, chest pain.  He took a flight to [place] on 06/28/20 and states symptoms seemed to get worse on the flight mainly extreme pain behind his sternum and shortness of breath.  He had to use a WC to get to baggage claim. He took anotehr round of steorid and abx from his pcp. He felt much better after this. This past Friday he had sudden onset of cough, shortness of breath and the chest pressure. He checked oxygen and it was 85% and came to ER.   Bilateral pulmonary embolism (HCC) RV dysfunction  -symptoms started about 2 months ago, received 3 courses of abx and steroid with no improvement. --started on heparin drip on admission. --Echo showed RV dysfunction, discussed with cardio, can be resulted by long-standing significant PE burden. Plan: --cont heparin gtt for another day, given extend of clot burden --will transition to Xarelto prior to  discharge. --will need repeat Echo as outpatient  Acute hypoxic respiratory failure 2/2 --2/2 PE and resulting RV dysfunction --CXR showed pleural effusion, cardiomegaly with vascular congestion --91% sats on presentation, placed on 2L, now on room air. Plan: --IV lasix 40 mg x1 today --consider a short course of oral lasix on discharge    Hyperlipidemia --cont home statin     Prediabetes --a1c 6.2 --no need for SSI     OSA on CPAP --pt reported compliance with home CPAP --cont own CPAP nightly   Folliculitis --cont home minocycline  Cough --present for about 2 months, with some nasal congestion.  Improved while on steroids, but returned when steroid course done. --no indication for more abx or steroid at this time. --Robitussin PRN   DVT prophylaxis: QI:[id number] gtt Code Status: Full code  Family Communication: wife updated at bedside today Level of care: Med-Surg Dispo:   The patient is from: home Anticipated d/c is to: home Anticipated d/c date is: tomorrow Patient currently is not medically ready to d/c due to: need at least 48 hours of heparin gtt due to significant PE and RV dysfunction.   Subjective and Interval History:  Pt reported breathing better today.  Sating well on room air now.     Objective: Vitals:   07/21/20 1306 07/21/20 1953 07/22/20 0510 07/22/20 1142  BP: 115/84 122/77 (!) 124/94 120/81  Pulse: 90 100 94 90  Resp: 18 20 20 18   Temp:  98 F (36.7 C) 98.1 F (36.7 C) 98.2 F (36.8 C) 98.2 F (36.8 C)  TempSrc: Oral Oral Oral Oral  SpO2: 97% 91% 95% 94%  Weight:      Height:        Intake/Output Summary (Last 24 hours) at 07/22/2020 1334 Last data filed at 07/22/2020 1217 Gross per 24 hour  Intake 335.18 ml  Output 1175 ml  Net -839.82 ml   Filed Weights   07/20/20 1022 07/20/20 1700  Weight: 113.4 kg 114.4 kg    Examination:   Constitutional: NAD, AAOx3 HEENT: conjunctivae and lids normal, EOMI CV: No cyanosis.   RESP:  normal respiratory effort, on RA Extremities: No effusions, edema in BLE SKIN: warm, dry Neuro: II - XII grossly intact.   Psych: Normal mood and affect.  Appropriate judgement and reason   Data Reviewed: I have personally reviewed following labs and imaging studies  CBC: Recent Labs  Lab 07/20/20 1032 07/21/20 0256 07/22/20 0717  WBC 7.4 7.8 7.3  HGB 15.4 15.8 14.9  HCT 47.5 48.3 46.2  MCV 88.3 87.0 87.5  PLT 149* 155 354   Basic Metabolic Panel: Recent Labs  Lab 07/20/20 1032 07/22/20 0717  NA 137 133*  K 4.0 4.0  CL 102 101  CO2 25 24  GLUCOSE 144* 160*  BUN 12 14  CREATININE 0.97 0.90  CALCIUM 8.9 9.0  MG  --  2.1   GFR: Estimated Creatinine Clearance: 101.1 mL/min (by C-G formula based on SCr of 0.9 mg/dL). Liver Function Tests: No results for input(s): AST, ALT, ALKPHOS, BILITOT, PROT, ALBUMIN in the last 168 hours. No results for input(s): LIPASE, AMYLASE in the last 168 hours. No results for input(s): AMMONIA in the last 168 hours. Coagulation Profile: No results for input(s): INR, PROTIME in the last 168 hours. Cardiac Enzymes: No results for input(s): CKTOTAL, CKMB, CKMBINDEX, TROPONINI in the last 168 hours. BNP (last 3 results) No results for input(s): PROBNP in the last 8760 hours. HbA1C: Recent Labs    07/21/20 0256  HGBA1C 6.2*   CBG: Recent Labs  Lab 07/20/20 1707  GLUCAP 107*   Lipid Profile: No results for input(s): CHOL, HDL, LDLCALC, TRIG, CHOLHDL, LDLDIRECT in the last 72 hours. Thyroid Function Tests: No results for input(s): TSH, T4TOTAL, FREET4, T3FREE, THYROIDAB in the last 72 hours. Anemia Panel: No results for input(s): VITAMINB12, FOLATE, FERRITIN, TIBC, IRON, RETICCTPCT in the last 72 hours. Sepsis Labs: No results for input(s): PROCALCITON, LATICACIDVEN in the last 168 hours.  Recent Results (from the past 240 hour(s))  Resp Panel by RT-PCR (Flu A&B, Covid) Nasopharyngeal Swab     Status: None   Collection Time:  07/20/20 10:58 AM   Specimen: Nasopharyngeal Swab; Nasopharyngeal(NP) swabs in vial transport medium  Result Value Ref Range Status   SARS Coronavirus 2 by RT PCR NEGATIVE NEGATIVE Final    Comment: (NOTE) SARS-CoV-2 target nucleic acids are NOT DETECTED.  The SARS-CoV-2 RNA is generally detectable in upper respiratory specimens during the acute phase of infection. The lowest concentration of SARS-CoV-2 viral copies this assay can detect is 138 copies/mL. A negative result does not preclude SARS-Cov-2 infection and should not be used as the sole basis for treatment or other patient management decisions. A negative result may occur with  improper specimen collection/handling, submission of specimen other than nasopharyngeal swab, presence of viral mutation(s) within the areas targeted by this assay, and inadequate number of viral copies(<138 copies/mL). A negative result must be combined with  clinical observations, patient history, and epidemiological information. The expected result is Negative.  Fact Sheet for Patients:  EntrepreneurPulse.com.au  Fact Sheet for Healthcare Providers:  IncredibleEmployment.be  This test is no t yet approved or cleared by the [place] FDA and  has been authorized for detection and/or diagnosis of SARS-CoV-2 by FDA under an Emergency Use Authorization (EUA). This EUA will remain  in effect (meaning this test can be used) for the duration of the COVID-19 declaration under Section 564(b)(1) of the Act, 21 U.S.C.section 360bbb-3(b)(1), unless the authorization is terminated  or revoked sooner.       Influenza A by PCR NEGATIVE NEGATIVE Final   Influenza B by PCR NEGATIVE NEGATIVE Final    Comment: (NOTE) The Xpert Xpress SARS-CoV-2/FLU/RSV plus assay is intended as an aid in the diagnosis of influenza from Nasopharyngeal swab specimens and should not be used as a sole basis for treatment. Nasal washings  and aspirates are unacceptable for Xpert Xpress SARS-CoV-2/FLU/RSV testing.  Fact Sheet for Patients: EntrepreneurPulse.com.au  Fact Sheet for Healthcare Providers: IncredibleEmployment.be  This test is not yet approved or cleared by the [place] FDA and has been authorized for detection and/or diagnosis of SARS-CoV-2 by FDA under an Emergency Use Authorization (EUA). This EUA will remain in effect (meaning this test can be used) for the duration of the COVID-19 declaration under Section 564(b)(1) of the Act, 21 U.S.C. section 360bbb-3(b)(1), unless the authorization is terminated or revoked.  Performed at [facility], [address] [place], [place] [address]       Radiology Studies: ECHOCARDIOGRAM COMPLETE  Result Date: 07/21/2020    ECHOCARDIOGRAM REPORT   Patient Name:   TEVAN MARIAN Date of Exam: 07/21/2020 Medical Rec #:  [id number]        Height:       66.0 in Accession #:    [id number]       Weight:       252.1 lb Date of Birth:  Mar 19, 1955       BSA:          2.207 m Patient Age:    37 years         BP:           118/80 mmHg Patient Gender: M                HR:           95 bpm. Exam Location:  Inpatient Procedure: 2D Echo, 3D Echo, Color Doppler, Cardiac Doppler and Strain Analysis Indications:    I26.02 Pulmonary embolus  History:        Patient has no prior history of Echocardiogram examinations.                 Risk Factors:Diabetes, Dyslipidemia and Sleep Apnea.  Sonographer:    Raquel Sarna Senior RDCS Referring Phys: [id number] [place]  1. Left ventricular ejection fraction, by estimation, is 55 to 60%. The left ventricle has normal function. The left ventricle has no regional wall motion abnormalities. Left ventricular diastolic parameters are consistent with Grade I diastolic dysfunction (impaired relaxation). There is the interventricular septum is flattened in systole and diastole, consistent with right  ventricular pressure and volume overload.  2. 3D echo calculated RV EF is 33%. RV systolic pressure is likely underestimated due to weak TR jet. Right ventricular systolic function is moderately reduced. The right ventricular size is severely enlarged. There is normal pulmonary artery systolic pressure.  3.  Right atrial size was mildly dilated.  4. A small pericardial effusion is present. The pericardial effusion is circumferential. There is no evidence of cardiac tamponade.  5. The mitral valve is normal in structure. No evidence of mitral valve regurgitation.  6. The aortic valve is tricuspid. Aortic valve regurgitation is trivial. Mild aortic valve sclerosis is present, with no evidence of aortic valve stenosis.  7. There is mild dilatation of the ascending aorta, measuring 38 mm.  8. The inferior vena cava is dilated in size with <50% respiratory variability, suggesting right atrial pressure of 15 mmHg. FINDINGS  Left Ventricle: Left ventricular ejection fraction, by estimation, is 55 to 60%. The left ventricle has normal function. The left ventricle has no regional wall motion abnormalities. The left ventricular internal cavity size was normal in size. There is  borderline concentric left ventricular hypertrophy. The interventricular septum is flattened in systole and diastole, consistent with right ventricular pressure and volume overload. Left ventricular diastolic parameters are consistent with Grade I diastolic dysfunction (impaired relaxation). Normal left ventricular filling pressure. Right Ventricle: 3D echo calculated RV EF is 33%. RV systolic pressure is likely underestimated due to weak TR jet. The right ventricular size is severely enlarged. No increase in right ventricular wall thickness. Right ventricular systolic function is moderately reduced. There is normal pulmonary artery systolic pressure. The tricuspid regurgitant velocity is 2.26 m/s, and with an assumed right atrial pressure of 15 mmHg,  the estimated right ventricular systolic pressure is 76.1 mmHg. Left Atrium: Left atrial size was normal in size. Right Atrium: Right atrial size was mildly dilated. Pericardium: A small pericardial effusion is present. The pericardial effusion is circumferential. There is no evidence of cardiac tamponade. Mitral Valve: The mitral valve is normal in structure. No evidence of mitral valve regurgitation. Tricuspid Valve: The tricuspid valve is normal in structure. Tricuspid valve regurgitation is trivial. Aortic Valve: The aortic valve is tricuspid. Aortic valve regurgitation is trivial. Mild aortic valve sclerosis is present, with no evidence of aortic valve stenosis. Pulmonic Valve: The pulmonic valve was normal in structure. Pulmonic valve regurgitation is trivial. Aorta: The aortic root is normal in size and structure. There is mild dilatation of the ascending aorta, measuring 38 mm. Venous: The inferior vena cava is dilated in size with less than 50% respiratory variability, suggesting right atrial pressure of 15 mmHg. IAS/Shunts: No atrial level shunt detected by color flow Doppler.  LEFT VENTRICLE PLAX 2D LVIDd:         4.40 cm  Diastology LVIDs:         3.10 cm  LV e' medial:    8.49 cm/s LV PW:         1.20 cm  LV E/e' medial:  7.0 LV IVS:        0.90 cm  LV e' lateral:   5.33 cm/s LVOT diam:     1.90 cm  LV E/e' lateral: 11.2 LV SV:         49 LV SV Index:   22 LVOT Area:     2.84 cm  RIGHT VENTRICLE RV S prime:     10.90 cm/s TAPSE (M-mode): 1.9 cm LEFT ATRIUM             Index       RIGHT ATRIUM           Index LA diam:        2.80 cm 1.27 cm/m  RA Area:     15.50 cm LA  Vol [facility]):   31.1 ml 14.09 ml/m RA Volume:   39.50 ml  17.90 ml/m LA Vol (A4C):   15.2 ml 6.89 ml/m LA Biplane Vol: 21.8 ml 9.88 ml/m  AORTIC VALVE LVOT Vmax:   138.00 cm/s LVOT Vmean:  95.200 cm/s LVOT VTI:    0.174 m  AORTA Ao Root diam: 3.30 cm Ao Asc diam:  3.80 cm MITRAL VALVE               TRICUSPID VALVE MV Area (PHT): 2.81  cm    TR Peak grad:   20.4 mmHg MV Decel Time: 270 msec    TR Vmax:        226.00 cm/s MV E velocity: 59.70 cm/s MV A velocity: 93.30 cm/s  SHUNTS MV E/A ratio:  0.64        Systemic VTI:  0.17 m                            Systemic Diam: 1.90 cm Mihai Croitoru MD Electronically signed by Sanda Klein MD Signature Date/Time: 07/21/2020/2:16:40 PM    Final      Scheduled Meds:  minocycline  50 mg Oral Daily   rosuvastatin  20 mg Oral Daily   sodium chloride flush  3 mL Intravenous Q12H   Continuous Infusions:  sodium chloride     heparin 2,250 Units/hr (07/22/20 1216)     LOS: 2 days     Enzo Bi, MD [facility] If 7PM-7AM, please contact night-coverage 07/22/2020, 1:34 PM

## 2020-07-22 NOTE — Plan of Care (Signed)

## 2020-07-22 NOTE — TOC Benefit Eligibility Note (Signed)
Patient Advocate Encounter   Prior Authorization for Xarelto 20 mg has been approved.     PA# [id number] Effective dates: 07/22/2020 through 07/22/2021   Patients co-pay is $30.00.       [name], [name] Patient Advocate Specialist [place] Antimicrobial Stewardship Team Direct Number: [phone]  Fax: [phone]

## 2020-07-22 NOTE — TOC Benefit Eligibility Note (Signed)
Patient Research scientist (life sciences) completed.     The patient is currently admitted and upon discharge could be taking Xarelto 20mg .   Requires Prior Authorization   The patient is insured through [place], [place] Patient Advocate Specialist [place] Team Direct Number: [phone]  Fax: [phone]

## 2020-07-23 ENCOUNTER — Encounter (HOSPITAL_COMMUNITY): Payer: Self-pay | Admitting: Internal Medicine

## 2020-07-23 ENCOUNTER — Other Ambulatory Visit (HOSPITAL_COMMUNITY): Payer: Self-pay

## 2020-07-23 LAB — BASIC METABOLIC PANEL
Anion gap: 10 (ref 5–15)
BUN: 11 mg/dL (ref 8–23)
CO2: 24 mmol/L (ref 22–32)
Calcium: 8.9 mg/dL (ref 8.9–10.3)
Chloride: 100 mmol/L (ref 98–111)
Creatinine, Ser: 0.9 mg/dL (ref 0.61–1.24)
GFR, Estimated: >60 mL/min (ref >60)
Glucose, Bld: 116 mg/dL — ABNORMAL HIGH (ref 70–99)
Potassium: 3.7 mmol/L (ref 3.5–5.1)
Sodium: 134 mmol/L — ABNORMAL LOW (ref 135–145)

## 2020-07-23 LAB — CBC
HCT: 42.7 % (ref 39.0–52.0)
Hemoglobin: 14.1 g/dL (ref 13.0–17.0)
MCH: 28.3 pg (ref 26.0–34.0)
MCHC: 33 g/dL (ref 30.0–36.0)
MCV: 85.6 fL (ref 80.0–100.0)
Platelets: 153 10*3/uL (ref 150–400)
RBC: 4.99 MIL/uL (ref 4.22–5.81)
RDW: 14 % (ref 11.5–15.5)
WBC: 5.9 10*3/uL (ref 4.0–10.5)
nRBC: 0 % (ref 0.0–0.2)

## 2020-07-23 LAB — MAGNESIUM: Magnesium: 2.1 mg/dL (ref 1.7–2.4)

## 2020-07-23 LAB — HEPARIN LEVEL (UNFRACTIONATED): Heparin Unfractionated: 0.34 IU/mL (ref 0.30–0.70)

## 2020-07-23 MED ORDER — RIVAROXABAN (XARELTO) VTE STARTER PACK (15 & 20 MG)
ORAL_TABLET | ORAL | 0 refills | Status: DC
  Filled 2020-07-23: qty 51, 30d supply, fill #0

## 2020-07-23 MED ORDER — ALBUTEROL SULFATE HFA 108 (90 BASE) MCG/ACT IN AERS: 2.0000 | INHALATION_SPRAY | Freq: Four times a day (QID) | RESPIRATORY_TRACT | 2 refills | Status: AC | PRN

## 2020-07-23 MED ORDER — RIVAROXABAN 15 MG PO TABS
15.0000 mg | ORAL_TABLET | ORAL | Status: AC
  Administered 2020-07-23: 15 mg via ORAL
  Filled 2020-07-23: qty 1

## 2020-07-23 MED ORDER — RIVAROXABAN 20 MG PO TABS: 20.0000 mg | ORAL_TABLET | ORAL | Status: DC

## 2020-07-23 MED ORDER — RIVAROXABAN (XARELTO) VTE STARTER PACK (15 & 20 MG): ORAL_TABLET | ORAL | 0 refills | Status: DC

## 2020-07-23 NOTE — Discharge Instructions (Signed)
Information on my medicine - XARELTO (rivaroxaban)  This medication education was reviewed with me or my healthcare representative as part of my discharge preparation.    WHY WAS XARELTO PRESCRIBED FOR YOU? Xarelto was prescribed to treat blood clots that may have been found in the veins of your legs (deep vein thrombosis) or in your lungs (pulmonary embolism) and to reduce the risk of them occurring again.  What do you need to know about Xarelto? The starting dose is one 15 mg tablet taken TWICE daily with food for the FIRST 21 DAYS then on (enter date)  08/13/20  the dose is changed to one 20 mg tablet taken ONCE A DAY with your evening meal.  DO NOT stop taking Xarelto without talking to the health care provider who prescribed the medication.  Refill your prescription for 20 mg tablets before you run out.  After discharge, you should have regular check-up appointments with your healthcare provider that is prescribing your Xarelto.  In the future your dose may need to be changed if your kidney function changes by a significant amount.  What do you do if you miss a dose? If you are taking Xarelto TWICE DAILY and you miss a dose, take it as soon as you remember. You may take two 15 mg tablets (total 30 mg) at the same time then resume your regularly scheduled 15 mg twice daily the next day.  If you are taking Xarelto ONCE DAILY and you miss a dose, take it as soon as you remember on the same day then continue your regularly scheduled once daily regimen the next day. Do not take two doses of Xarelto at the same time.   Important Safety Information Xarelto is a blood thinner medicine that can cause bleeding. You should call your healthcare provider right away if you experience any of the following: Bleeding from an injury or your nose that does not stop. Unusual colored urine (red or dark brown) or unusual colored stools (red or black). Unusual bruising for unknown reasons. A serious  fall or if you hit your head (even if there is no bleeding).  Some medicines may interact with Xarelto and might increase your risk of bleeding while on Xarelto. To help avoid this, consult your healthcare provider or pharmacist prior to using any new prescription or non-prescription medications, including herbals, vitamins, non-steroidal anti-inflammatory drugs (NSAIDs) and supplements.  This website has more information on Xarelto: [email or web address].[email or web address]

## 2020-07-23 NOTE — Discharge Summary (Signed)
Physician Discharge Summary  Samuel Madden OEU:[id number] DOB: August 31, 1955 DOA: 07/20/2020  PCP: Chesley Noon, MD  Admit date: 07/20/2020 Discharge date: 07/23/2020  Admitted From: Home Disposition:  Home  Recommendations for Outpatient Follow-up:  Follow up with PCP in 1-2 weeks Please obtain BMP/CBC in one week Please follow up with imaging as discussed  Home Health:None  Equipment/Devices:None  Discharge Condition:Stable  CODE STATUS: Full Diet recommendation: Low-salt low-fat diet  Brief/Interim Summary: Samuel Madden is a 65 y.o. male with medical history significant of prediabetes, HLD, OSA on CPAP, low testosterone and obesity  who presented to ED with shortness of breath. He states symptoms started at beginning of May with migrating rib pain. He tried a TENS unit and ibuprofen and pain subsided. He was then supposed to go on vacation on 06/01/20 and had some shortness of breath. He did a covid test and negative at home. Went on vacation and did [place] stuff, but was more winded with exertion/stairs. He came back from vacation and went to an UC and was given a steroid shot and round of abx. This helped for a little while. He saw his PCP on 5/24 and was given steroids and another round of abx. He finished his medication and states his symptoms started coming back with shortness of breath, chest pain.  He took a flight to [place] on 06/28/20 and states symptoms seemed to get worse on the flight mainly extreme pain behind his sternum and shortness of breath.  He had to use a WC to get to baggage claim. He took anotehr round of steorid and abx from his pcp. He felt much better after this. This past Friday he had sudden onset of cough, shortness of breath and the chest pressure. He checked oxygen and it was 85% and came to ER.  Patient admitted as above with acute chest pain respiratory failure and transient hypoxia in the setting of bilateral PE with RV dysfunction.  Patient was  placed on IV heparin over the last 24 hours with remarkable improvement in symptoms.  Patient now transitioning to p.o. Xarelto otherwise stable and agreeable for discharge home.  Recommend close follow-up with PCP in the next few weeks to ensure resolution of symptoms, will need repeat imaging in 3 months time including CTA as well as echocardiogram.  Patient's hypoxia has resolved, ambulating today without hypoxia or symptoms.  Lengthy discussion at bedside with patient and over the phone with his wife about risk factors for recurrent DVT and PE given his job and sedentary lifestyle.   Discharge Diagnoses:  Principal Problem:   Bilateral pulmonary embolism (HCC) Active Problems:   Hyperlipidemia   Prediabetes   OSA on CPAP    Discharge Instructions  Discharge Instructions     Call MD for:  difficulty breathing, headache or visual disturbances   Complete by: As directed    Call MD for:  extreme fatigue   Complete by: As directed    Call MD for:  persistant dizziness or light-headedness   Complete by: As directed    Call MD for:  severe uncontrolled pain   Complete by: As directed    Diet - low sodium heart healthy   Complete by: As directed    Increase activity slowly   Complete by: As directed       Allergies as of 07/23/2020   No Known Allergies      Medication List     STOP taking these medications    aspirin EC  81 MG tablet   ibuprofen 200 MG tablet Commonly known as: ADVIL       TAKE these medications    albuterol 108 (90 Base) MCG/ACT inhaler Commonly known as: VENTOLIN HFA Inhale 2 puffs into the lungs every 6 (six) hours as needed for wheezing or shortness of breath.   dextromethorphan-guaiFENesin 30-600 MG 12hr tablet Commonly known as: MUCINEX DM Take 1 tablet by mouth 2 (two) times daily as needed for cough.   fluticasone 50 MCG/ACT nasal spray Commonly known as: FLONASE Place 1-2 sprays into both nostrils daily as needed for allergies or  rhinitis.   minocycline 50 MG capsule Commonly known as: MINOCIN TAKE ONE CAPSULE BY MOUTH TWICE A DAY What changed: when to take this   Ozempic (0.25 or 0.5 MG/DOSE) 2 MG/1.5ML Sopn Generic drug: Semaglutide(0.25 or 0.5MG /DOS) Inject 0.25 mg into the skin once a week.   Rivaroxaban Stater Pack (15 mg and 20 mg) Commonly known as: XARELTO STARTER PACK Follow package directions: Take one 15mg  tablet by mouth twice a day. On day 22, switch to one 20mg  tablet once a day. Take with food.   rosuvastatin 20 MG tablet Commonly known as: CRESTOR Take 20 mg by mouth daily.   tadalafil 5 MG tablet Commonly known as: CIALIS Take 5 mg by mouth daily.   Testosterone 30 MG/ACT Soln Place 1 application onto the skin daily.        No Known Allergies  Consultations: None  Procedures/Studies: CT Angio Chest PE W and/or Wo Contrast  Result Date: 07/20/2020 CLINICAL DATA:  Hyper bubble ED pulmonary embolism Shortness of breath Rib and sternal pain Recent airplane travel EXAM: CT ANGIOGRAPHY CHEST WITH CONTRAST TECHNIQUE: Multidetector CT imaging of the chest was performed using the standard protocol during bolus administration of intravenous contrast. Multiplanar CT image reconstructions and MIPs were obtained to evaluate the vascular anatomy. CONTRAST:  151mL OMNIPAQUE IOHEXOL 350 MG/ML SOLN COMPARISON:  None. FINDINGS: Cardiovascular: Heart size at upper limits of normal. Mediastinal lipomatosis. Bilateral pulmonary artery emboli involving distal main, lobar, segmental, and subsegmental branches. Burden of clot is greater on the right. Mildly elevated RV/LV ratio measuring 1.1. Mediastinum/Nodes: No enlarged mediastinal axillary, supraclavicular, or hilar lymph nodes. Lungs/Pleura: Scattered calcified granulomas seen bilaterally.Small left pleural effusion is present with adjacent atelectasis. Upper Abdomen: No acute abnormality. Musculoskeletal: No chest wall abnormality. No acute or significant  osseous findings. Review of the MIP images confirms the above findings. IMPRESSION: 1. Bilateral pulmonary artery embolism involving distal main, lobar, segmental and subsegmental branches. Clot burden is greater on the right. 2. Small left pleural effusion with adjacent atelectasis. 3. Mildly elevated RV/LV ratio measuring 1.1. Electronically Signed   By: Miachel Roux M.D.   On: 07/20/2020 11:50   DG Chest Portable 1 View  Result Date: 07/20/2020 CLINICAL DATA:  Shortness of breath. EXAM: PORTABLE CHEST 1 VIEW COMPARISON:  None. FINDINGS: 1040 hours. Left base collapse/consolidation with small to moderate left pleural effusion. No focal airspace consolidation or substantial effusion in the right lung. The cardio pericardial silhouette is enlarged. There is pulmonary vascular congestion without overt pulmonary edema. The visualized bony structures of the thorax show no acute abnormality. Telemetry leads overlie the chest. IMPRESSION: Left base collapse/consolidation with small to moderate left pleural effusion. Cardiomegaly with vascular congestion. Electronically Signed   By: Misty Stanley M.D.   On: 07/20/2020 11:10   ECHOCARDIOGRAM COMPLETE  Result Date: 07/21/2020    ECHOCARDIOGRAM REPORT   Patient Name:   Samuel Madden  [facility] Date of Exam: 07/21/2020 Medical Rec #:  [id number]        Height:       66.0 in Accession #:    [id number]       Weight:       252.1 lb Date of Birth:  [birth year]       BSA:          2.207 m Patient Age:    11 years         BP:           118/80 mmHg Patient Gender: M                HR:           95 bpm. Exam Location:  Inpatient Procedure: 2D Echo, 3D Echo, Color Doppler, Cardiac Doppler and Strain Analysis Indications:    I26.02 Pulmonary embolus  History:        Patient has no prior history of Echocardiogram examinations.                 Risk Factors:Diabetes, Dyslipidemia and Sleep Apnea.  Sonographer:    Raquel Sarna Senior RDCS Referring Phys: [id number] [place]  1. Left  ventricular ejection fraction, by estimation, is 55 to 60%. The left ventricle has normal function. The left ventricle has no regional wall motion abnormalities. Left ventricular diastolic parameters are consistent with Grade I diastolic dysfunction (impaired relaxation). There is the interventricular septum is flattened in systole and diastole, consistent with right ventricular pressure and volume overload.  2. 3D echo calculated RV EF is 33%. RV systolic pressure is likely underestimated due to weak TR jet. Right ventricular systolic function is moderately reduced. The right ventricular size is severely enlarged. There is normal pulmonary artery systolic pressure.  3. Right atrial size was mildly dilated.  4. A small pericardial effusion is present. The pericardial effusion is circumferential. There is no evidence of cardiac tamponade.  5. The mitral valve is normal in structure. No evidence of mitral valve regurgitation.  6. The aortic valve is tricuspid. Aortic valve regurgitation is trivial. Mild aortic valve sclerosis is present, with no evidence of aortic valve stenosis.  7. There is mild dilatation of the ascending aorta, measuring 38 mm.  8. The inferior vena cava is dilated in size with <50% respiratory variability, suggesting right atrial pressure of 15 mmHg. FINDINGS  Left Ventricle: Left ventricular ejection fraction, by estimation, is 55 to 60%. The left ventricle has normal function. The left ventricle has no regional wall motion abnormalities. The left ventricular internal cavity size was normal in size. There is  borderline concentric left ventricular hypertrophy. The interventricular septum is flattened in systole and diastole, consistent with right ventricular pressure and volume overload. Left ventricular diastolic parameters are consistent with Grade I diastolic dysfunction (impaired relaxation). Normal left ventricular filling pressure. Right Ventricle: 3D echo calculated RV EF is 33%. RV  systolic pressure is likely underestimated due to weak TR jet. The right ventricular size is severely enlarged. No increase in right ventricular wall thickness. Right ventricular systolic function is moderately reduced. There is normal pulmonary artery systolic pressure. The tricuspid regurgitant velocity is 2.26 m/s, and with an assumed right atrial pressure of 15 mmHg, the estimated right ventricular systolic pressure is 02.6 mmHg. Left Atrium: Left atrial size was normal in size. Right Atrium: Right atrial size was mildly dilated. Pericardium: A small pericardial effusion is present. The pericardial effusion is circumferential. There is no evidence of cardiac  tamponade. Mitral Valve: The mitral valve is normal in structure. No evidence of mitral valve regurgitation. Tricuspid Valve: The tricuspid valve is normal in structure. Tricuspid valve regurgitation is trivial. Aortic Valve: The aortic valve is tricuspid. Aortic valve regurgitation is trivial. Mild aortic valve sclerosis is present, with no evidence of aortic valve stenosis. Pulmonic Valve: The pulmonic valve was normal in structure. Pulmonic valve regurgitation is trivial. Aorta: The aortic root is normal in size and structure. There is mild dilatation of the ascending aorta, measuring 38 mm. Venous: The inferior vena cava is dilated in size with less than 50% respiratory variability, suggesting right atrial pressure of 15 mmHg. IAS/Shunts: No atrial level shunt detected by color flow Doppler.  LEFT VENTRICLE PLAX 2D LVIDd:         4.40 cm  Diastology LVIDs:         3.10 cm  LV e' medial:    8.49 cm/s LV PW:         1.20 cm  LV E/e' medial:  7.0 LV IVS:        0.90 cm  LV e' lateral:   5.33 cm/s LVOT diam:     1.90 cm  LV E/e' lateral: 11.2 LV SV:         49 LV SV Index:   22 LVOT Area:     2.84 cm  RIGHT VENTRICLE RV S prime:     10.90 cm/s TAPSE (M-mode): 1.9 cm LEFT ATRIUM             Index       RIGHT ATRIUM           Index LA diam:        2.80 cm  1.27 cm/m  RA Area:     15.50 cm LA Vol (A2C):   31.1 ml 14.09 ml/m RA Volume:   39.50 ml  17.90 ml/m LA Vol (A4C):   15.2 ml 6.89 ml/m LA Biplane Vol: 21.8 ml 9.88 ml/m  AORTIC VALVE LVOT Vmax:   138.00 cm/s LVOT Vmean:  95.200 cm/s LVOT VTI:    0.174 m  AORTA Ao Root diam: 3.30 cm Ao Asc diam:  3.80 cm MITRAL VALVE               TRICUSPID VALVE MV Area (PHT): 2.81 cm    TR Peak grad:   20.4 mmHg MV Decel Time: 270 msec    TR Vmax:        226.00 cm/s MV E velocity: 59.70 cm/s MV A velocity: 93.30 cm/s  SHUNTS MV E/A ratio:  0.64        Systemic VTI:  0.17 m                            Systemic Diam: 1.90 cm Mihai Croitoru MD Electronically signed by Sanda Klein MD Signature Date/Time: 07/21/2020/2:16:40 PM    Final      Subjective: No acute issues or events overnight, shortness of breath chest pain resolved, cough still ongoing but improving, otherwise denies nausea vomiting diarrhea constipation headache fevers chills   Discharge Exam: Vitals:   07/23/20 0815 07/23/20 0820  BP:    Pulse:    Resp:    Temp:    SpO2: 90% 92%   Vitals:   07/22/20 1957 07/23/20 0513 07/23/20 0815 07/23/20 0820  BP: 130/86 128/66    Pulse: 95 89    Resp: 20 20  Temp: 99.2 F (37.3 C) 98.6 F (37 C)    TempSrc: Oral Oral    SpO2: 93% 98% 90% 92%  Weight:      Height:        General: Pt is alert, awake, not in acute distress Cardiovascular: RRR, S1/S2 +, no rubs, no gallops Respiratory: CTA bilaterally, no wheezing, no rhonchi Abdominal: Soft, NT, ND, bowel sounds + Extremities: no edema, no cyanosis    The results of significant diagnostics from this hospitalization (including imaging, microbiology, ancillary and laboratory) are listed below for reference.     Microbiology: Recent Results (from the past 240 hour(s))  Resp Panel by RT-PCR (Flu A&B, Covid) Nasopharyngeal Swab     Status: None   Collection Time: 07/20/20 10:58 AM   Specimen: Nasopharyngeal Swab; Nasopharyngeal(NP) swabs  in vial transport medium  Result Value Ref Range Status   SARS Coronavirus 2 by RT PCR NEGATIVE NEGATIVE Final    Comment: (NOTE) SARS-CoV-2 target nucleic acids are NOT DETECTED.  The SARS-CoV-2 RNA is generally detectable in upper respiratory specimens during the acute phase of infection. The lowest concentration of SARS-CoV-2 viral copies this assay can detect is 138 copies/mL. A negative result does not preclude SARS-Cov-2 infection and should not be used as the sole basis for treatment or other patient management decisions. A negative result may occur with  improper specimen collection/handling, submission of specimen other than nasopharyngeal swab, presence of viral mutation(s) within the areas targeted by this assay, and inadequate number of viral copies(<138 copies/mL). A negative result must be combined with clinical observations, patient history, and epidemiological information. The expected result is Negative.  Fact Sheet for Patients:  EntrepreneurPulse.com.au  Fact Sheet for Healthcare Providers:  IncredibleEmployment.be  This test is no t yet approved or cleared by the [place] FDA and  has been authorized for detection and/or diagnosis of SARS-CoV-2 by FDA under an Emergency Use Authorization (EUA). This EUA will remain  in effect (meaning this test can be used) for the duration of the COVID-19 declaration under Section 564(b)(1) of the Act, 21 U.S.C.section 360bbb-3(b)(1), unless the authorization is terminated  or revoked sooner.       Influenza A by PCR NEGATIVE NEGATIVE Final   Influenza B by PCR NEGATIVE NEGATIVE Final    Comment: (NOTE) The Xpert Xpress SARS-CoV-2/FLU/RSV plus assay is intended as an aid in the diagnosis of influenza from Nasopharyngeal swab specimens and should not be used as a sole basis for treatment. Nasal washings and aspirates are unacceptable for Xpert Xpress  SARS-CoV-2/FLU/RSV testing.  Fact Sheet for Patients: EntrepreneurPulse.com.au  Fact Sheet for Healthcare Providers: IncredibleEmployment.be  This test is not yet approved or cleared by the [place] FDA and has been authorized for detection and/or diagnosis of SARS-CoV-2 by FDA under an Emergency Use Authorization (EUA). This EUA will remain in effect (meaning this test can be used) for the duration of the COVID-19 declaration under Section 564(b)(1) of the Act, 21 U.S.C. section 360bbb-3(b)(1), unless the authorization is terminated or revoked.  Performed at [facility], [address] [place], [place] [address]      Labs: BNP (last 3 results) Recent Labs    07/20/20 1842  BNP 24.5   Basic Metabolic Panel: Recent Labs  Lab 07/20/20 1032 07/22/20 0717 07/23/20 0337  NA 137 133* 134*  K 4.0 4.0 3.7  CL 102 101 100  CO2 25 24 24   GLUCOSE 144* 160* 116*  BUN 12 14 11  CREATININE 0.97 0.90 0.90  CALCIUM 8.9 9.0 8.9  MG  --  2.1 2.1   Liver Function Tests: No results for input(s): AST, ALT, ALKPHOS, BILITOT, PROT, ALBUMIN in the last 168 hours. No results for input(s): LIPASE, AMYLASE in the last 168 hours. No results for input(s): AMMONIA in the last 168 hours. CBC: Recent Labs  Lab 07/20/20 1032 07/21/20 0256 07/22/20 0717 07/23/20 0337  WBC 7.4 7.8 7.3 5.9  HGB 15.4 15.8 14.9 14.1  HCT 47.5 48.3 46.2 42.7  MCV 88.3 87.0 87.5 85.6  PLT 149* 155 163 153   Cardiac Enzymes: No results for input(s): CKTOTAL, CKMB, CKMBINDEX, TROPONINI in the last 168 hours. BNP: Invalid input(s): POCBNP CBG: Recent Labs  Lab 07/20/20 1707  GLUCAP 107*   D-Dimer No results for input(s): DDIMER in the last 72 hours. Hgb A1c Recent Labs    07/21/20 0256  HGBA1C 6.2*   Lipid Profile No results for input(s): CHOL, HDL, LDLCALC, TRIG, CHOLHDL, LDLDIRECT in the last 72 hours. Thyroid function studies No results  for input(s): TSH, T4TOTAL, T3FREE, THYROIDAB in the last 72 hours.  Invalid input(s): FREET3 Anemia work up No results for input(s): VITAMINB12, FOLATE, FERRITIN, TIBC, IRON, RETICCTPCT in the last 72 hours. Urinalysis No results found for: COLORURINE, APPEARANCEUR, [place], [place], GLUCOSEU, [place], [place], [place], PROTEINUR, UROBILINOGEN, NITRITE, LEUKOCYTESUR Sepsis Labs Invalid input(s): PROCALCITONIN,  WBC,  LACTICIDVEN Microbiology Recent Results (from the past 240 hour(s))  Resp Panel by RT-PCR (Flu A&B, Covid) Nasopharyngeal Swab     Status: None   Collection Time: 07/20/20 10:58 AM   Specimen: Nasopharyngeal Swab; Nasopharyngeal(NP) swabs in vial transport medium  Result Value Ref Range Status   SARS Coronavirus 2 by RT PCR NEGATIVE NEGATIVE Final    Comment: (NOTE) SARS-CoV-2 target nucleic acids are NOT DETECTED.  The SARS-CoV-2 RNA is generally detectable in upper respiratory specimens during the acute phase of infection. The lowest concentration of SARS-CoV-2 viral copies this assay can detect is 138 copies/mL. A negative result does not preclude SARS-Cov-2 infection and should not be used as the sole basis for treatment or other patient management decisions. A negative result may occur with  improper specimen collection/handling, submission of specimen other than nasopharyngeal swab, presence of viral mutation(s) within the areas targeted by this assay, and inadequate number of viral copies(<138 copies/mL). A negative result must be combined with clinical observations, patient history, and epidemiological information. The expected result is Negative.  Fact Sheet for Patients:  EntrepreneurPulse.com.au  Fact Sheet for Healthcare Providers:  IncredibleEmployment.be  This test is no t yet approved or cleared by the [place] FDA and  has been authorized for detection and/or diagnosis of SARS-CoV-2 by FDA under an  Emergency Use Authorization (EUA). This EUA will remain  in effect (meaning this test can be used) for the duration of the COVID-19 declaration under Section 564(b)(1) of the Act, 21 U.S.C.section 360bbb-3(b)(1), unless the authorization is terminated  or revoked sooner.       Influenza A by PCR NEGATIVE NEGATIVE Final   Influenza B by PCR NEGATIVE NEGATIVE Final    Comment: (NOTE) The Xpert Xpress SARS-CoV-2/FLU/RSV plus assay is intended as an aid in the diagnosis of influenza from Nasopharyngeal swab specimens and should not be used as a sole basis for treatment. Nasal washings and aspirates are unacceptable for Xpert Xpress SARS-CoV-2/FLU/RSV testing.  Fact Sheet for Patients: EntrepreneurPulse.com.au  Fact Sheet for Healthcare Providers: IncredibleEmployment.be  This test is not yet approved or cleared by  the [employer] and has been authorized for detection and/or diagnosis of SARS-CoV-2 by FDA under an Emergency Use Authorization (EUA). This EUA will remain in effect (meaning this test can be used) for the duration of the COVID-19 declaration under Section 564(b)(1) of the Act, 21 U.S.C. section 360bbb-3(b)(1), unless the authorization is terminated or revoked.  Performed at [facility], [address] [place], [place] [address]      Time coordinating discharge: Over 30 minutes  SIGNED:   Little Ishikawa, DO [facility] 07/23/2020, 11:37 AM Pager   If 7PM-7AM, please contact night-coverage [email or web address]

## 2020-07-23 NOTE — Progress Notes (Signed)
ANTICOAGULATION CONSULT NOTE  Pharmacy Consult for heparin Indication: pulmonary embolus 07/20/20  No Known Allergies  Patient Measurements: Height: 5\' 6"  (167.6 cm) Weight: 114.4 kg (252 lb 1.6 oz) IBW/kg (Calculated) : 63.8 Heparin Dosing Weight: 90kg  Vital Signs: Temp: 98.6 F (37 C) (06/30 0513) Temp Source: Oral (06/30 0513) BP: 128/66 (06/30 0513) Pulse Rate: 89 (06/30 0513)  Labs: Recent Labs    07/20/20 1032 07/20/20 1259 07/20/20 1842 07/21/20 0256 07/21/20 1121 07/21/20 2136 07/22/20 0717 07/23/20 0337  HGB 15.4  --   --  15.8  --   --  14.9 14.1  HCT 47.5  --   --  48.3  --   --  46.2 42.7  PLT 149*  --   --  155  --   --  163 153  HEPARINUNFRC  --   --    < > 0.14*   < > 0.32 0.35 0.34  CREATININE 0.97  --   --   --   --   --  0.90 0.90  TROPONINIHS 13 15  --   --   --   --   --   --    < > = values in this interval not displayed.    Estimated Creatinine Clearance: 101.1 mL/min (by C-G formula based on SCr of 0.9 mg/dL).    Assessment: 65 yo M found to have acute bilateral PE 07/20/20, higher clot burden on right, mild RHS. No anticoagulation prior to admission. Pharmacy consulted for heparin.    Heparin level continues to be therapeutic at 0.34. CBC stable. Possible change to xarelto prior to discharge  Goal of Therapy:  Heparin level 0.3-0.7 units/ml Monitor platelets by anticoagulation protocol: Yes   Plan:  Continue heparin at 2250 units / hr Daily heparin level, CBC  Tammie Ellsworth A. Levada Dy, PharmD, BCPS, FNKF Clinical Pharmacist [place] Please utilize Amion for appropriate phone number to reach the unit pharmacist ([place])   07/23/2020 7:33 AM

## 2020-07-23 NOTE — Progress Notes (Signed)
Ambulating O2 performed. Pt oxygen saturation maintained between 90-92% while walking.

## 2020-07-29 DIAGNOSIS — E7800 Pure hypercholesterolemia, unspecified: Secondary | ICD-10-CM | POA: Diagnosis not present

## 2020-07-29 DIAGNOSIS — I2699 Other pulmonary embolism without acute cor pulmonale: Secondary | ICD-10-CM | POA: Diagnosis not present

## 2020-07-29 DIAGNOSIS — R059 Cough, unspecified: Secondary | ICD-10-CM | POA: Diagnosis not present

## 2020-07-31 ENCOUNTER — Telehealth: Payer: Self-pay

## 2020-07-31 NOTE — Telephone Encounter (Signed)
NOTES ON FILE FROM [facility], REFERRAL SENT TO SCHEDULING

## 2020-08-03 ENCOUNTER — Other Ambulatory Visit (HOSPITAL_COMMUNITY): Payer: Self-pay

## 2020-08-03 ENCOUNTER — Telehealth (HOSPITAL_COMMUNITY): Payer: Self-pay | Admitting: Pharmacist

## 2020-08-03 ENCOUNTER — Encounter: Payer: Self-pay | Admitting: Pulmonary Disease

## 2020-08-03 ENCOUNTER — Ambulatory Visit: Payer: BC Managed Care – PPO | Admitting: Pulmonary Disease

## 2020-08-03 ENCOUNTER — Other Ambulatory Visit: Payer: Self-pay

## 2020-08-03 VITALS — BP 118/80 | HR 95 | Ht 66.0 in | Wt 249.0 lb

## 2020-08-03 DIAGNOSIS — I2699 Other pulmonary embolism without acute cor pulmonale: Secondary | ICD-10-CM

## 2020-08-03 DIAGNOSIS — Z9989 Dependence on other enabling machines and devices: Secondary | ICD-10-CM | POA: Diagnosis not present

## 2020-08-03 DIAGNOSIS — Z6841 Body Mass Index (BMI) 40.0 and over, adult: Secondary | ICD-10-CM | POA: Diagnosis not present

## 2020-08-03 DIAGNOSIS — G4733 Obstructive sleep apnea (adult) (pediatric): Secondary | ICD-10-CM | POA: Diagnosis not present

## 2020-08-03 NOTE — Progress Notes (Signed)
Synopsis: Referred in July 2022 for PE by Chesley Noon, MD  Subjective:   PATIENT ID: Samuel Madden GENDER: male DOB: [birth year], MRN: [id number]  Chief Complaint  Patient presents with   Consult    Cough and bilateral pulmonary embolism    PMH DMII, obesity. He developed cp and sob was seen in the ED, found to have bilateral PE.  From respiratory standpoint he is doing much better.  Not requiring oxygen.  Was discharged on Xarelto.  His brother had a pulmonary embolism.  He had this is his first pulmonary embolism deemed to be unprovoked.  He does live a relatively sedentary lifestyle states that he is in his office desk for several hours at a time.  He is also morbidly obese.  He does not smoke.  He is up-to-date with his cancer screening.  His last colonoscopy was within the past year and a half and his last check with his primary care for PSA was back down within normal limits.   Past Medical History:  Diagnosis Date   Atypical mole 03/26/2009   mild-left mid back   Diabetes mellitus without complication ([place])      Family History  Problem Relation Age of Onset   Hypertension Mother    Macular degeneration Mother    Diabetes Father    Heart disease Father    Prostate cancer Father      Past Surgical History:  Procedure Laterality Date   COLONOSCOPY     2021   SHOULDER ARTHROSCOPY Left    UMBILICAL HERNIA REPAIR      Social History   Socioeconomic History   Marital status: Divorced    Spouse name: Not on file   Number of children: Not on file   Years of education: Not on file   Highest education level: Not on file  Occupational History   Not on file  Tobacco Use   Smoking status: Former    Pack years: 0.00    Types: Cigars   Smokeless tobacco: Never   Tobacco comments:    Smoked cigars socially, last one 2021  Vaping Use   Vaping Use: Never used  Substance and Sexual Activity   Alcohol use: Yes    Alcohol/week: 3.0 standard drinks    Types: 1  Glasses of wine, 1 Cans of beer, 1 Shots of liquor per week   Drug use: Not Currently    Types: Marijuana   Sexual activity: Not on file  Other Topics Concern   Not on file  Social History Narrative   Not on file   Social Determinants of Health   Financial Resource Strain: Not on file  Food Insecurity: Not on file  Transportation Needs: Not on file  Physical Activity: Not on file  Stress: Not on file  Social Connections: Not on file  Intimate Partner Violence: Not on file     No Known Allergies   Outpatient Medications Prior to Visit  Medication Sig Dispense Refill   albuterol (VENTOLIN HFA) 108 (90 Base) MCG/ACT inhaler Inhale 2 puffs into the lungs every 6 (six) hours as needed for wheezing or shortness of breath. 8 g 2   dextromethorphan-guaiFENesin (MUCINEX DM) 30-600 MG 12hr tablet Take 1 tablet by mouth 2 (two) times daily as needed for cough.     fluticasone (FLONASE) 50 MCG/ACT nasal spray Place 1-2 sprays into both nostrils daily as needed for allergies or rhinitis.     minocycline (MINOCIN) 50 MG capsule TAKE ONE  CAPSULE BY MOUTH TWICE A DAY 30 capsule 5   OZEMPIC, 0.25 OR 0.5 MG/DOSE, 2 MG/1.5ML SOPN Inject 0.25 mg into the skin once a week.     RIVAROXABAN (XARELTO) VTE STARTER PACK (15 & 20 MG) Follow package directions: Take one 15mg  tablet by mouth twice a day. On day 22, switch to one 20mg  tablet once a day. Take with food. 51 each 0   rosuvastatin (CRESTOR) 20 MG tablet Take 20 mg by mouth daily.     tadalafil (CIALIS) 5 MG tablet Take 5 mg by mouth daily.     Testosterone 30 MG/ACT SOLN Place 1 application onto the skin daily.     No facility-administered medications prior to visit.    Review of Systems  Constitutional:  Negative for chills, fever, malaise/fatigue and weight loss.  HENT:  Negative for hearing loss, sore throat and tinnitus.   Eyes:  Negative for blurred vision and double vision.  Respiratory:  Positive for shortness of breath. Negative for  cough, hemoptysis, sputum production, wheezing and stridor.   Cardiovascular:  Positive for leg swelling. Negative for chest pain, palpitations, orthopnea and PND.  Gastrointestinal:  Negative for abdominal pain, constipation, diarrhea, heartburn, nausea and vomiting.  Genitourinary:  Negative for dysuria, hematuria and urgency.  Musculoskeletal:  Negative for joint pain and myalgias.  Skin:  Negative for itching and rash.  Neurological:  Negative for dizziness, tingling, weakness and headaches.  Endo/Heme/Allergies:  Negative for environmental allergies. Does not bruise/bleed easily.  Psychiatric/Behavioral:  Negative for depression. The patient is not nervous/anxious and does not have insomnia.   All other systems reviewed and are negative.   Objective:  Physical Exam Vitals reviewed.  Constitutional:      General: He is not in acute distress.    Appearance: He is well-developed. He is obese.  HENT:     Head: Normocephalic and atraumatic.  Eyes:     General: No scleral icterus.    Conjunctiva/sclera: Conjunctivae normal.     Pupils: Pupils are equal, round, and reactive to light.  Neck:     Vascular: No JVD.     Trachea: No tracheal deviation.  Cardiovascular:     Rate and Rhythm: Normal rate and regular rhythm.     Heart sounds: Normal heart sounds. No murmur heard. Pulmonary:     Effort: Pulmonary effort is normal. No tachypnea, accessory muscle usage or respiratory distress.     Breath sounds: No stridor. No wheezing, rhonchi or rales.  Abdominal:     General: Bowel sounds are normal. There is no distension.     Palpations: Abdomen is soft.     Tenderness: There is no abdominal tenderness.  Musculoskeletal:        General: No tenderness.     Cervical back: Neck supple.  Lymphadenopathy:     Cervical: No cervical adenopathy.  Skin:    General: Skin is warm and dry.     Capillary Refill: Capillary refill takes less than 2 seconds.     Findings: No rash.  Neurological:      Mental Status: He is alert and oriented to person, place, and time.  Psychiatric:        Behavior: Behavior normal.     Vitals:   08/03/20 1426  BP: 118/80  Pulse: 95  SpO2: 93%  Weight: 249 lb (112.9 kg)  Height: 5\' 6"  (1.676 m)   93% on RA BMI Readings from Last 3 Encounters:  08/03/20 40.19 kg/m  07/20/20 40.69 kg/m  05/29/12 37.93 kg/m   Wt Readings from Last 3 Encounters:  08/03/20 249 lb (112.9 kg)  07/20/20 252 lb 1.6 oz (114.4 kg)  05/29/12 235 lb (106.6 kg)     CBC    Component Value Date/Time   WBC 5.9 07/23/2020 0337   RBC 4.99 07/23/2020 0337   HGB 14.1 07/23/2020 0337   HCT 42.7 07/23/2020 0337   PLT 153 07/23/2020 0337   MCV 85.6 07/23/2020 0337   MCH 28.3 07/23/2020 0337   MCHC 33.0 07/23/2020 0337   RDW 14.0 07/23/2020 0337     Chest Imaging: 07/20/2020 CTA chest: Bilateral pulmonary emboli. No significant parenchymal infiltrates. Small left-sided effusion. The patient's images have been independently reviewed by me.    Pulmonary Functions Testing Results: No flowsheet data found.  FeNO:   Pathology:   Echocardiogram:   Heart Catheterization:     Assessment & Plan:     ICD-10-CM   1. Bilateral pulmonary embolism (HCC)  I26.99     2. OSA on CPAP  G47.33    Z99.89     3. BMI 40.0-44.9, adult [facility])  Z68.41       Discussion:  This is a 65 year old male, recently admitted to the hospital for chest pain shortness of breath found to have bilateral pulmonary emboli.  Patient has a BMI of 40.  He lives a relatively sedentary lifestyle.  He is also on testosterone application..  This also increase his risk for development of pulmonary embolism.  Plan: Continue Xarelto He needs at least 3 months of continuous anticoagulation. After that can decide whether or not we want to come off or stay on for primary prevention. If he does make a decision for coming off anticoagulation would need a least transition to 81 mg  aspirin. Unfortunately he developed this clot while he was taking 81 mg aspirin daily. We discussed also the utility potentially of having genetic screening.  Some of the labs we cannot order at the moment since he is on anticoagulation but some of the genetic tests we could to see if he was have a hypercoagulable state.  Patient will follow-up with [place] in 3 months.     Current Outpatient Medications:    albuterol (VENTOLIN HFA) 108 (90 Base) MCG/ACT inhaler, Inhale 2 puffs into the lungs every 6 (six) hours as needed for wheezing or shortness of breath., Disp: 8 g, Rfl: 2   dextromethorphan-guaiFENesin (MUCINEX DM) 30-600 MG 12hr tablet, Take 1 tablet by mouth 2 (two) times daily as needed for cough., Disp: , Rfl:    fluticasone (FLONASE) 50 MCG/ACT nasal spray, Place 1-2 sprays into both nostrils daily as needed for allergies or rhinitis., Disp: , Rfl:    minocycline (MINOCIN) 50 MG capsule, TAKE ONE CAPSULE BY MOUTH TWICE A DAY, Disp: 30 capsule, Rfl: 5   OZEMPIC, 0.25 OR 0.5 MG/DOSE, 2 MG/1.5ML SOPN, Inject 0.25 mg into the skin once a week., Disp: , Rfl:    RIVAROXABAN (XARELTO) VTE STARTER PACK (15 & 20 MG), Follow package directions: Take one 15mg  tablet by mouth twice a day. On day 22, switch to one 20mg  tablet once a day. Take with food., Disp: 51 each, Rfl: 0   rosuvastatin (CRESTOR) 20 MG tablet, Take 20 mg by mouth daily., Disp: , Rfl:    tadalafil (CIALIS) 5 MG tablet, Take 5 mg by mouth daily., Disp: , Rfl:    Testosterone 30 MG/ACT SOLN, Place 1 application onto the skin daily., Disp: , Rfl:  Garner Nash, DO [facility] 08/03/2020 2:54 PM

## 2020-08-03 NOTE — Telephone Encounter (Signed)
First attempt at reaching patient, left VM

## 2020-08-03 NOTE — Patient Instructions (Signed)
Thank you for visiting Dr. Valeta Harms at [facility]. Today we recommend the following:  Call with any change in symptoms.   Return in about 3 months (around 11/03/2020) for with APP or Dr. Valeta Harms.    Please do your part to reduce the spread of COVID-19.

## 2020-08-04 ENCOUNTER — Telehealth: Payer: Self-pay

## 2020-08-04 NOTE — Telephone Encounter (Signed)
-----   Message from Garner Nash, DO sent at 08/03/2020  6:52 PM EDT ----- Larene Beach, I was reviewing this patient's chart when I was closing his note and he is on a testosterone supplement.  Can you call him and let him know that he needs to discuss with his primary care provider about coming off of the testosterone supplement.  This increases his risk for the development of blood clots and I would prefer that he comes off of this. Thanks Garner Nash, DO [facility] 08/03/2020 6:52 PM

## 2020-08-04 NOTE — Telephone Encounter (Signed)
Spoke with patient and he is currently taking testosterone supplement. Advised patient the risk of blood clots per Dr Valeta Harms.  Patient has an appointment scheduled with primary care provider and will discuss options then.

## 2020-08-06 ENCOUNTER — Telehealth (HOSPITAL_COMMUNITY): Payer: Self-pay | Admitting: Pharmacist

## 2020-08-06 DIAGNOSIS — M7989 Other specified soft tissue disorders: Secondary | ICD-10-CM | POA: Diagnosis not present

## 2020-08-06 DIAGNOSIS — I2699 Other pulmonary embolism without acute cor pulmonale: Secondary | ICD-10-CM | POA: Diagnosis not present

## 2020-08-06 DIAGNOSIS — E349 Endocrine disorder, unspecified: Secondary | ICD-10-CM | POA: Diagnosis not present

## 2020-08-06 DIAGNOSIS — I50811 Acute right heart failure: Secondary | ICD-10-CM | POA: Diagnosis not present

## 2020-08-06 NOTE — Telephone Encounter (Signed)
Pharmacy Transitions of Care Follow-up Telephone Call   Date of discharge: 07/23/2020   How have you been since you were released from the hospital?  Good   Medication changes made at discharge:  - START: albuterol; xarelto starter pack  - STOPPED: ibuprofen, EC ASA 81  - CHANGED: minocycline   Medication changes verified by the patient? Yes     Medication Accessibility:   Home Pharmacy: [facility]   Was the patient provided with refills on discharged medications? No   Have all prescriptions been transferred from [facility] to home pharmacy? N/A   Medication Review:   RIVAROXABAN (XARELTO) Rivaroxaban 15 mg BID initiated on 06/30. Will switch to 20 mg daily after 21 days (7/22).   - Discussed importance of taking medication with food and around the same time everyday - Reviewed potential DDIs with patient - Advised patient of medications to avoid (NSAIDs, ASA) - Educated that Tylenol (acetaminophen) will be the preferred analgesic to prevent risk of bleeding - Emphasized importance of monitoring for signs and symptoms of bleeding (abnormal bruising, prolonged bleeding, nose bleeds, bleeding from gums, discolored urine, black tarry stools) - Advised patient to alert all providers of anticoagulation therapy prior to starting a new medication or having a procedure    If their condition worsens, is the pt aware to call PCP or go to the Emergency Dept.?  Yes   Final Patient Assessment: Patient reports he is doing well.  His appt with PCP went well.

## 2020-08-11 ENCOUNTER — Other Ambulatory Visit: Payer: Self-pay

## 2020-08-11 ENCOUNTER — Ambulatory Visit (HOSPITAL_BASED_OUTPATIENT_CLINIC_OR_DEPARTMENT_OTHER): Payer: BC Managed Care – PPO | Admitting: Cardiology

## 2020-08-11 VITALS — BP 108/70 | HR 91 | Ht 66.0 in | Wt 248.4 lb

## 2020-08-11 DIAGNOSIS — I2699 Other pulmonary embolism without acute cor pulmonale: Secondary | ICD-10-CM

## 2020-08-11 DIAGNOSIS — Z7182 Exercise counseling: Secondary | ICD-10-CM

## 2020-08-11 DIAGNOSIS — Z7189 Other specified counseling: Secondary | ICD-10-CM

## 2020-08-11 DIAGNOSIS — R931 Abnormal findings on diagnostic imaging of heart and coronary circulation: Secondary | ICD-10-CM

## 2020-08-11 DIAGNOSIS — E669 Obesity, unspecified: Secondary | ICD-10-CM

## 2020-08-11 DIAGNOSIS — E1169 Type 2 diabetes mellitus with other specified complication: Secondary | ICD-10-CM

## 2020-08-11 NOTE — Patient Instructions (Signed)
Medication Instructions:  Your Physician recommend you continue on your current medication as directed.    *If you need a refill on your cardiac medications before your next appointment, please call your pharmacy*   Lab Work: None ordered today   Testing/Procedures: Your physician has requested that you have an echocardiogram in October, 2022. Echocardiography is a painless test that uses sound waves to create images of your heart. It provides your doctor with information about the size and shape of your heart and how well your heart's chambers and valves are working. This procedure takes approximately one hour. There are no restrictions for this procedure. [address]    Follow-Up: At [facility], you and your health needs are our priority.  As part of our continuing mission to provide you with exceptional heart care, we have created designated Provider Care Teams.  These Care Teams include your primary Cardiologist (physician) and Advanced Practice Providers (APPs -  Physician Assistants and Nurse Practitioners) who all work together to provide you with the care you need, when you need it.  We recommend signing up for the patient portal called "MyChart".  Sign up information is provided on this After Visit Summary.  MyChart is used to connect with patients for Virtual Visits (Telemedicine).  Patients are able to view lab/test results, encounter notes, upcoming appointments, etc.  Non-urgent messages can be sent to your provider as well.   To learn more about what you can do with MyChart, go to [email or web address].    Your next appointment:   Based on test results  The format for your next appointment:   In Person  Provider:   Buford Dresser, MD

## 2020-08-11 NOTE — Progress Notes (Signed)
Cardiology Office Note:    Date:  08/11/2020   ID:  Samuel Madden, DOB 27-Dec-1955, MRN [id number]  PCP:  Chesley Noon, MD  Cardiologist:  Buford Dresser, MD  Referring MD: Chesley Noon, MD   CC: new patient consultation for abnormal echocardiogram  History of Present Illness:    Samuel Madden is a 65 y.o. male with a hx of diabetes mellitus, morbid obesity, and pure hypocholesterolemia who is seen as a new consult at the request of Chesley Noon, MD for the evaluation and management of chest tightness, evidence of pericardial effusion and right-sided heart failure, and abnormal Echo results.  Today: Tobacco use history: Occasionally smokes cigars (nothing in the past year). Some marijuana use when he was younger. Family history: Father died at 5 yo with possible heart attack or stroke. Mother has no cardiac issues. Cholesterol issues ("whole family") Prior cardiac testing and/or incidental findings on other testing (ie coronary calcium): Stress test at 65 yo, told it was normal Exercise level: Avoiding exertion and strenuous activity due to heart concerns. He wants to return to the gym and restart his exercise routines.  He is accompanied by his wife. Last November he was infected with COVID. He went to [place] [facility], where he was told he had a moderately enlarged heart. While in the ED 06/2020 for bilateral PE he was and told the right side of his heart was enlarged and not working efficiently. His main concern today is the possibility of permanent damage to his heart. He regularly monitors his heart rate with the FitBit. Previously, his resting HR was 55-60 on average. On July 6th his HR peaked at 93, and last night his HR was 77 bpm. He does not check his blood pressure at home, but states he is usually stable.  For activity, he has not participated in strenuous activity due to his heart concerns. In the past 2-3 days he has new left chest tightness.  However, this pain is very minor (1-2/10), and he notes it may just be in his head. This chest tightness is also associated with a "twinge"-like pain in his left shoulder. He believes this may be due to sleeping on his left side. As of now, he does not believe his pain is triggered by eating or activity.  He has been on Crestor since his early 85's.  He denies any shortness of breath, palpitations, or exertional symptoms. No headaches, lightheadedness, or syncope to report. Also has no lower extremity edema, orthopnea or PND.   Past Medical History:  Diagnosis Date   Atypical mole 03/26/2009   mild-left mid back   Diabetes mellitus without complication [facility])     Past Surgical History:  Procedure Laterality Date   COLONOSCOPY     2021   SHOULDER ARTHROSCOPY Left    UMBILICAL HERNIA REPAIR      Current Medications: Current Outpatient Medications on File Prior to Visit  Medication Sig   albuterol (VENTOLIN HFA) 108 (90 Base) MCG/ACT inhaler Inhale 2 puffs into the lungs every 6 (six) hours as needed for wheezing or shortness of breath.   dextromethorphan-guaiFENesin (MUCINEX DM) 30-600 MG 12hr tablet Take 1 tablet by mouth 2 (two) times daily as needed for cough.   fluticasone (FLONASE) 50 MCG/ACT nasal spray Place 1-2 sprays into both nostrils daily as needed for allergies or rhinitis.   minocycline (MINOCIN) 50 MG capsule TAKE ONE CAPSULE BY MOUTH TWICE A DAY   OZEMPIC, 0.25 OR 0.5 MG/DOSE,  2 MG/1.5ML SOPN Inject 0.25 mg into the skin once a week.   RIVAROXABAN (XARELTO) VTE STARTER PACK (15 & 20 MG) Follow package directions: Take one 15mg  tablet by mouth twice a day. On day 22, switch to one 20mg  tablet once a day. Take with food.   rosuvastatin (CRESTOR) 20 MG tablet Take 20 mg by mouth daily.   tadalafil (CIALIS) 5 MG tablet Take 5 mg by mouth daily.   Testosterone 30 MG/ACT SOLN Place 1 application onto the skin daily.   No current facility-administered medications on file  prior to visit.     Allergies:   Patient has no known allergies.   Social History   Tobacco Use   Smoking status: Former    Types: Cigars   Smokeless tobacco: Never   Tobacco comments:    Smoked cigars socially, last one 2021  Vaping Use   Vaping Use: Never used  Substance Use Topics   Alcohol use: Yes    Alcohol/week: 3.0 standard drinks    Types: 1 Glasses of wine, 1 Cans of beer, 1 Shots of liquor per week   Drug use: Not Currently    Types: Marijuana    Family History: family history includes Deep vein thrombosis in his brother; Diabetes in his father; Heart disease in his father; Hypertension in his mother; Macular degeneration in his mother; Prostate cancer in his father.  ROS:   Please see the history of present illness.  Additional pertinent ROS: Constitutional: Negative for chills, fever, night sweats, unintentional weight loss  HENT: Negative for ear pain and hearing loss.   Eyes: Negative for loss of vision and eye pain.  Respiratory: Negative for cough, sputum, wheezing.   Cardiovascular: See HPI. Gastrointestinal: Negative for abdominal pain, melena, and hematochezia.  Genitourinary: Negative for dysuria and hematuria.  Musculoskeletal: Positive for left shoulder pain. Negative for falls and myalgias.  Skin: Negative for itching and rash.  Neurological: Negative for focal weakness, focal sensory changes and loss of consciousness.  Endo/Heme/Allergies: Does not bruise/bleed easily.     EKGs/Labs/Other Studies Reviewed:    The following studies were reviewed today:  Echo 07/21/2020: 1. Left ventricular ejection fraction, by estimation, is 55 to 60%. The  left ventricle has normal function. The left ventricle has no regional  wall motion abnormalities. Left ventricular diastolic parameters are  consistent with Grade I diastolic  dysfunction (impaired relaxation). There is the interventricular septum is  flattened in systole and diastole, consistent with  right ventricular  pressure and volume overload.   2. 3D echo calculated RV EF is 33%. RV systolic pressure is likely  underestimated due to weak TR jet. Right ventricular systolic function is  moderately reduced. The right ventricular size is severely enlarged. There  is normal pulmonary artery systolic  pressure.   3. Right atrial size was mildly dilated.   4. A small pericardial effusion is present. The pericardial effusion is  circumferential. There is no evidence of cardiac tamponade.   5. The mitral valve is normal in structure. No evidence of mitral valve  regurgitation.   6. The aortic valve is tricuspid. Aortic valve regurgitation is trivial.  Mild aortic valve sclerosis is present, with no evidence of aortic valve  stenosis.   7. There is mild dilatation of the ascending aorta, measuring 38 mm.   8. The inferior vena cava is dilated in size with <50% respiratory  variability, suggesting right atrial pressure of 15 mmHg.  EKG:  EKG is personally reviewed.  08/11/2020: SR at 91 bpm, nonspecific st pattern  Recent Labs: 07/20/2020: B Natriuretic Peptide 21.6 07/23/2020: BUN 11; Creatinine, Ser 0.90; Hemoglobin 14.1; Magnesium 2.1; Platelets 153; Potassium 3.7; Sodium 134  Recent Lipid Panel No results found for: CHOL, TRIG, HDL, CHOLHDL, VLDL, LDLCALC, LDLDIRECT  Physical Exam:    VS:  BP 108/70   Pulse 91   Ht 5\' 6"  (1.676 m)   Wt 248 lb 6.4 oz (112.7 kg)   BMI 40.09 kg/m     Wt Readings from Last 3 Encounters:  08/11/20 248 lb 6.4 oz (112.7 kg)  08/03/20 249 lb (112.9 kg)  07/20/20 252 lb 1.6 oz (114.4 kg)    GEN: Well nourished, well developed in no acute distress HEENT: Normal, moist mucous membranes NECK: No JVD CARDIAC: regular rhythm, normal S1 and S2, no rubs or gallops. No murmur. VASCULAR: Radial and DP pulses 2+ bilaterally. No carotid bruits RESPIRATORY:  Clear to auscultation without rales, wheezing or rhonchi  ABDOMEN: Soft, non-tender,  non-distended MUSCULOSKELETAL:  Ambulates independently SKIN: Warm and dry, no edema NEUROLOGIC:  Alert and oriented x 3. No focal neuro deficits noted. PSYCHIATRIC:  Normal affect    ASSESSMENT:    1. Bilateral pulmonary embolism ([place])   2. Abnormal echocardiogram   3. Cardiac risk counseling   4. Counseling on health promotion and disease prevention   5. Exercise counseling   6. Diabetes mellitus type 2 in obese [facility])    PLAN:    Abnormal echocardiogram History of pulmonary embolism, bilateral -given timing of his PE and desire to return to exercise, will repeat echocardiogram to evaluate RV function and filling pressures -reviewed prior echo results today  -continue anticoagulation with rivaroxaban, per PCP  Type II diabetes, with obesity (BMI 40) -on semaglutide -on rosuvastatin 20 mg for prevention -no aspirin as he is on anticoagulation  Cardiac risk counseling and prevention recommendations: -recommend heart healthy/Mediterranean diet, with whole grains, fruits, vegetable, fish, lean meats, nuts, and olive oil. Limit salt. -recommend moderate walking, 3-5 times/week for 30-50 minutes each session. Aim for at least 150 minutes.week. Goal should be pace of 3 miles/hours, or walking 1.5 miles in 30 minutes -recommend avoidance of tobacco products. Avoid excess alcohol. -ASCVD risk score: The ASCVD Risk score Mikey Bussing DC Jr., et al., 2013) failed to calculate for the following reasons:   Cannot find a previous HDL lab    Plan for follow up: to be determined based on results of echo  Buford Dresser, MD, PhD, [facility]    Medication Adjustments/Labs and Tests Ordered: Current medicines are reviewed at length with the patient today.  Concerns regarding medicines are outlined above.  Orders Placed This Encounter  Procedures   EKG 12-Lead   ECHOCARDIOGRAM COMPLETE   No orders of the defined types were placed in this encounter.   Patient  Instructions  Medication Instructions:  Your Physician recommend you continue on your current medication as directed.    *If you need a refill on your cardiac medications before your next appointment, please call your pharmacy*   Lab Work: None ordered today   Testing/Procedures: Your physician has requested that you have an echocardiogram in October, 2022. Echocardiography is a painless test that uses sound waves to create images of your heart. It provides your doctor with information about the size and shape of your heart and how well your heart's chambers and valves are working. This procedure takes approximately one hour. There are no restrictions for this  procedure. [place] [address]    Follow-Up: At [facility], you and your health needs are our priority.  As part of our continuing mission to provide you with exceptional heart care, we have created designated Provider Care Teams.  These Care Teams include your primary Cardiologist (physician) and Advanced Practice Providers (APPs -  Physician Assistants and Nurse Practitioners) who all work together to provide you with the care you need, when you need it.  We recommend signing up for the patient portal called "MyChart".  Sign up information is provided on this After Visit Summary.  MyChart is used to connect with patients for Virtual Visits (Telemedicine).  Patients are able to view lab/test results, encounter notes, upcoming appointments, etc.  Non-urgent messages can be sent to your provider as well.   To learn more about what you can do with MyChart, go to [email or web address].    Your next appointment:   Based on test results  The format for your next appointment:   In Person  Provider:   Buford Dresser, MD     [facility],acting as a scribe for Buford Dresser, MD.,have documented all relevant documentation on the behalf of Buford Dresser, MD,as directed by  Buford Dresser, MD while in the presence of Buford Dresser, MD.  I, Buford Dresser, MD, have reviewed all documentation for this visit. The documentation on 09/16/20 for the exam, diagnosis, procedures, and orders are all accurate and complete.   Signed, Buford Dresser, MD PhD 08/11/2020 9:09 AM   [place]

## 2020-09-07 DIAGNOSIS — E7800 Pure hypercholesterolemia, unspecified: Secondary | ICD-10-CM | POA: Diagnosis not present

## 2020-09-07 DIAGNOSIS — I2699 Other pulmonary embolism without acute cor pulmonale: Secondary | ICD-10-CM | POA: Diagnosis not present

## 2020-09-07 DIAGNOSIS — I50811 Acute right heart failure: Secondary | ICD-10-CM | POA: Diagnosis not present

## 2020-09-07 DIAGNOSIS — E349 Endocrine disorder, unspecified: Secondary | ICD-10-CM | POA: Diagnosis not present

## 2020-09-16 ENCOUNTER — Encounter (HOSPITAL_BASED_OUTPATIENT_CLINIC_OR_DEPARTMENT_OTHER): Payer: Self-pay | Admitting: Cardiology

## 2020-10-14 DIAGNOSIS — R7989 Other specified abnormal findings of blood chemistry: Secondary | ICD-10-CM | POA: Diagnosis not present

## 2020-10-14 DIAGNOSIS — I2699 Other pulmonary embolism without acute cor pulmonale: Secondary | ICD-10-CM | POA: Diagnosis not present

## 2020-10-14 DIAGNOSIS — I2782 Chronic pulmonary embolism: Secondary | ICD-10-CM | POA: Diagnosis not present

## 2020-10-14 DIAGNOSIS — I50811 Acute right heart failure: Secondary | ICD-10-CM | POA: Diagnosis not present

## 2020-10-27 ENCOUNTER — Telehealth: Payer: Self-pay

## 2020-10-27 NOTE — Telephone Encounter (Signed)
NOTES SCANNED TO REFERRAL 

## 2020-11-03 ENCOUNTER — Encounter: Payer: Self-pay | Admitting: Adult Health

## 2020-11-03 ENCOUNTER — Ambulatory Visit (INDEPENDENT_AMBULATORY_CARE_PROVIDER_SITE_OTHER): Payer: BC Managed Care – PPO | Admitting: Adult Health

## 2020-11-03 ENCOUNTER — Other Ambulatory Visit: Payer: Self-pay

## 2020-11-03 DIAGNOSIS — Z9989 Dependence on other enabling machines and devices: Secondary | ICD-10-CM | POA: Diagnosis not present

## 2020-11-03 DIAGNOSIS — G4733 Obstructive sleep apnea (adult) (pediatric): Secondary | ICD-10-CM

## 2020-11-03 DIAGNOSIS — I2699 Other pulmonary embolism without acute cor pulmonale: Secondary | ICD-10-CM

## 2020-11-03 NOTE — Assessment & Plan Note (Signed)
Bilateral PE-recent consultation with hematology with recommendations to continue on lifelong anticoagulation.  Patient education was given. Patient does remain on testosterone which increases his risk for thrombus. Patient has an upcoming echo next week to follow-up for right heart strain. Patient education on avoidance of nonsteroidals  Plan  Patient Instructions  Continue on Xarelto daily .  Report any signs of bleeding.  Avoid NSAIDS , motrin, advil , aleve, etc.  Follow for Echo next week  Follow up with Hematology as planned Saline nasal rinses Twice daily   Saline nasal gel At bedtime   Mucinex DM liquid Twice daily  As needed  cough.  Continue on CPAP At bedtime .  Work on healthy weight .  Follow up with Dr. Valeta Harms in 6 months and As needed

## 2020-11-03 NOTE — Patient Instructions (Addendum)
Continue on Xarelto daily .  Report any signs of bleeding.  Avoid NSAIDS , motrin, advil , aleve, etc.  Follow for Echo next week  Follow up with Hematology as planned Saline nasal rinses Twice daily   Saline nasal gel At bedtime   Mucinex DM liquid Twice daily  As needed  cough.  Continue on CPAP At bedtime .  Work on healthy weight .  Follow up with Dr. Valeta Harms in 6 months and As needed

## 2020-11-03 NOTE — Progress Notes (Signed)
@Patient  ID: Samuel Madden, male    DOB: [birth year], 65 y.o.   MRN: [id number]  Chief Complaint  Patient presents with   Follow-up    Referring provider: Chesley Noon, MD  HPI: 65 year old male former smoker seen for pulmonary consult July 2022 for bilateral pulmonary emboli in the setting of travel , obesity with BMI of 41, testosterone use.  Medical history significant for diabetes and OSA   TEST/EVENTS :  2D echo July 21, 2020 EF is 67%, grade 1 diastolic dysfunction, RV systolic function moderately reduced, RV size severely enlarged, normal pulmonary artery systolic pressure.  CT chest angio July 20, 2020 bilateral PE involving the distal main, lobar, segmental, subsegmental branches clot burden is greater on the right, mildly elevated RV/LV ratio 1.1  11/03/2020 Follow up: PE  Patient presents for a 47-month follow-up.  Patient was seen for pulmonary consult in July 2022.  After he was diagnosed with bilateral pulmonary emboli in July 2022.  Prior to diagnosis of PE.  He had been traveling.  He also uses testosterone on a daily basis.  Patient was treated with IV heparin and transition to Xarelto.  As above CT chest showed bilateral PE.  2D echo showed moderately reduced RV systolic function and severely enlarged RV size.  He has seen cardiology and has an upcoming echo next week.  Patient says his shortness of breath has improved.  Activity tolerance is also improving.  He is back to playing golf.  Does have some occasional dry cough and postnasal drainage.  Patient says he is having no trouble with Xarelto seems to be tolerating well.  He has no known bleeding.   Has seen Hematology Dr. Marylou Mccoy at [facility].  Notes were reviewed.  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  Also recommended ongoing anticoagulation if he remains on testosterone as he has increased risk for thrombosis.  Patient did have a hypercoagulable panel (on Xarelto)  Lupus anticoagulant was positive.  Patient does have a family history of blood clots. Patient says he tried to come off of testosterone but had significant symptom burden and has now restarted.  He has an upcoming appointment with hematology.  Patient does have underlying sleep apnea.  Says he is maintained well on CPAP at bedtime.  He uses his CPAP every single night usually gets in about 6 hours.  He does have the Airview app on his phone.  That shows excellent compliance with 100% usage and AHI around 0.1/hour.  Patient says he buys all of his supplies online.  He uses the DreamWear nasal pillows.  No Known Allergies  Immunization History  Administered Date(s) Administered   Influenza Split 11/05/2018   Influenza,inj,Quad PF,6+ Mos 11/02/2020   PFIZER(Purple Top)SARS-COV-2 Vaccination 03/31/2019, 05/01/2019   Tdap 01/25/2011    Past Medical History:  Diagnosis Date   Atypical mole 03/26/2009   mild-left mid back   Diabetes mellitus without complication ([place])     Tobacco History: Social History   Tobacco Use  Smoking Status Former   Types: Cigars  Smokeless Tobacco Never  Tobacco Comments   Smoked cigars socially, last one 2021   Counseling given: Not Answered Tobacco comments: Smoked cigars socially, last one 2021   Outpatient Medications Prior to Visit  Medication Sig Dispense Refill   albuterol (VENTOLIN HFA) 108 (90 Base) MCG/ACT inhaler Inhale 2 puffs into the lungs every 6 (six) hours as needed for wheezing or shortness of breath. 8 g 2  fluticasone (FLONASE) 50 MCG/ACT nasal spray Place 1-2 sprays into both nostrils daily as needed for allergies or rhinitis.     minocycline (MINOCIN) 50 MG capsule TAKE ONE CAPSULE BY MOUTH TWICE A DAY 30 capsule 5   OZEMPIC, 0.25 OR 0.5 MG/DOSE, 2 MG/1.5ML SOPN Inject 0.25 mg into the skin once a week.     RIVAROXABAN (XARELTO) VTE STARTER PACK (15 & 20 MG) Follow package directions: Take one 15mg  tablet by mouth twice a day. On  day 22, switch to one 20mg  tablet once a day. Take with food. 51 each 0   rosuvastatin (CRESTOR) 20 MG tablet Take 20 mg by mouth daily.     tadalafil (CIALIS) 5 MG tablet Take 5 mg by mouth daily.     Testosterone 30 MG/ACT SOLN Place 1 application onto the skin daily.     No facility-administered medications prior to visit.     Review of Systems:   Constitutional:   No  weight loss, night sweats,  Fevers, chills, fatigue, or  lassitude.  HEENT:   No headaches,  Difficulty swallowing,  Tooth/dental problems, or  Sore throat,                No sneezing, itching, ear ache, n +asal congestion, post nasal drip,   CV:  No chest pain,  Orthopnea, PND, swelling in lower extremities, anasarca, dizziness, palpitations, syncope.   GI  No heartburn, indigestion, abdominal pain, nausea, vomiting, diarrhea, change in bowel habits, loss of appetite, bloody stools.   Resp: No shortness of breath with exertion or at rest.  No excess mucus, no productive cough,  No non-productive cough,  No coughing up of blood.  No change in color of mucus.  No wheezing.  No chest wall deformity  Skin: no rash or lesions.  GU: no dysuria, change in color of urine, no urgency or frequency.  No flank pain, no hematuria   MS:  No joint pain or swelling.  No decreased range of motion.  No back pain.    Physical Exam  BP 120/70 (BP Location: Left Arm, Patient Position: Sitting, Cuff Size: Large)   Pulse 83   Temp 98.6 F (37 C) (Oral)   Ht 5\' 6"  (1.676 m)   Wt 258 lb 12.8 oz (117.4 kg)   SpO2 92%   BMI 41.77 kg/m   GEN: A/Ox3; pleasant , NAD, well nourished    HEENT:  [place]/AT,   NOSE-clear, THROAT-clear, no lesions, no postnasal drip or exudate noted.   NECK:  Supple w/ fair ROM; no JVD; normal carotid impulses w/o bruits; no thyromegaly or nodules palpated; no lymphadenopathy.    RESP  Clear  P & A; w/o, wheezes/ rales/ or rhonchi. no accessory muscle use, no dullness to percussion  CARD:  RRR, no  m/r/g, no peripheral edema, pulses intact, no cyanosis or clubbing.  GI:   Soft & nt; nml bowel sounds; no organomegaly or masses detected.   Musco: Warm bil, no deformities or joint swelling noted.   Neuro: alert, no focal deficits noted.    Skin: Warm, no lesions or rashes    Lab Results:   BNP  ProBNP No results found for: PROBNP  Imaging: No results found.    No flowsheet data found.  No results found for: NITRICOXIDE      Assessment & Plan:   Bilateral pulmonary embolism ([place]) Bilateral PE-recent consultation with hematology with recommendations to continue on lifelong anticoagulation.  Patient education was given. Patient does  remain on testosterone which increases his risk for thrombus. Patient has an upcoming echo next week to follow-up for right heart strain. Patient education on avoidance of nonsteroidals  Plan  Patient Instructions  Continue on Xarelto daily .  Report any signs of bleeding.  Avoid NSAIDS , motrin, advil , aleve, etc.  Follow for Echo next week  Follow up with Hematology as planned Saline nasal rinses Twice daily   Saline nasal gel At bedtime   Mucinex DM liquid Twice daily  As needed  cough.  Continue on CPAP At bedtime .  Work on healthy weight .  Follow up with Dr. Valeta Harms in 6 months and As needed         OSA on CPAP Appears to be doing well on CPAP.  Continue on CPAP at bedtime.     Rexene Edison, NP 11/03/2020

## 2020-11-03 NOTE — Assessment & Plan Note (Signed)
Appears to be doing well on CPAP.  Continue on CPAP at bedtime.

## 2020-11-11 ENCOUNTER — Other Ambulatory Visit: Payer: Self-pay

## 2020-11-11 ENCOUNTER — Ambulatory Visit (HOSPITAL_COMMUNITY): Payer: BC Managed Care – PPO | Attending: Cardiology

## 2020-11-11 DIAGNOSIS — I2699 Other pulmonary embolism without acute cor pulmonale: Secondary | ICD-10-CM | POA: Insufficient documentation

## 2020-11-11 LAB — ECHOCARDIOGRAM COMPLETE
AR max vel: 1.77 cm2
AV Area VTI: 1.87 cm2
AV Area mean vel: 1.83 cm2
AV Mean grad: 8 mmHg
AV Peak grad: 15.8 mmHg
Ao pk vel: 1.99 m/s
Area-P 1/2: 3.42 cm2
P 1/2 time: 315 msec
S' Lateral: 3 cm

## 2020-12-02 ENCOUNTER — Ambulatory Visit: Payer: BC Managed Care – PPO | Admitting: Dermatology

## 2020-12-16 ENCOUNTER — Ambulatory Visit (HOSPITAL_BASED_OUTPATIENT_CLINIC_OR_DEPARTMENT_OTHER): Payer: BC Managed Care – PPO | Admitting: Cardiology

## 2020-12-29 NOTE — Progress Notes (Signed)
Cardiology Office Note:    Date:  12/30/2020   ID:  Samuel Madden, DOB 08/22/1955, MRN [id number]  PCP:  Samuel Noon, MD  Cardiologist:  Samuel Dresser, MD  Referring MD: Samuel Noon, MD   CC: Follow-up  History of Present Illness:    Samuel Madden is a 65 y.o. male with a hx of diabetes mellitus, morbid obesity, and pure hypocholesterolemia who is seen for follow-up. I initially met him 08/11/2020 as a new consult at the request of Samuel Noon, MD for the evaluation and management of chest tightness, evidence of pericardial effusion and right-sided heart failure, and abnormal Echo results.  Tobacco use history: Occasionally smokes cigars (nothing in the past year). Some marijuana use when he was younger. Family history: Father died at 69 yo with possible heart attack or stroke. Mother has no cardiac issues. Cholesterol issues ("whole family") Prior cardiac testing and/or incidental findings on other testing (ie coronary calcium): Stress test at 65 yo, told it was normal Exercise level: Avoiding exertion and strenuous activity due to heart concerns. He wants to return to the gym and restart his exercise routines.  In November 2021 he was infected with COVID. He went to [place] [facility], where he was told he had a moderately enlarged heart. While in the ED 06/2020 for bilateral PE he was and told the right side of his heart was enlarged and not working efficiently.   Today: Overall he is feeling pretty good.   For activity, he has played golf, up to 36 holes in one day. He does ride in a cart, but is still able to achieve over 10k steps on consecutive days. He plans on returning to the gym and increasing his exercise. He denies any exertional symptoms that alarms him, he knows he is still deconditioned.   In his diet there are certain foods that trigger him to diurese more frequently. At this time he is unsure which foods are the triggers.  He recently  followed up with his pulmonologist. It was felt his cough was related to his sinuses. He will cough if he eats ice cream. He notes this feels like "a hiatal hernia in his larynx". It was also recommended that his testosterone be discontinued. Lately he has decreased his testosterone by half.    Currently he is using a CPAP which is working well.   Of note, he reports an injury on his left LE while moving furniture weeks ago. He fell in the trailer and twisted his leg. A lesion there is still tender and shows some discoloration.  He denies any palpitations, lightheadedness, headaches, syncope, orthopnea, or PND.   Past Medical History:  Diagnosis Date   Atypical mole 03/26/2009   mild-left mid back   Diabetes mellitus without complication [facility])     Past Surgical History:  Procedure Laterality Date   COLONOSCOPY     2021   SHOULDER ARTHROSCOPY Left    UMBILICAL HERNIA REPAIR      Current Medications: Current Outpatient Medications on File Prior to Visit  Medication Sig   fluticasone (FLONASE) 50 MCG/ACT nasal spray Place 1-2 sprays into both nostrils daily as needed for allergies or rhinitis.   minocycline (MINOCIN) 50 MG capsule TAKE ONE CAPSULE BY MOUTH TWICE A DAY   OZEMPIC, 0.25 OR 0.5 MG/DOSE, 2 MG/1.5ML SOPN Inject 0.25 mg into the skin once a week.   rivaroxaban (XARELTO) 20 MG TABS tablet Take 20 mg by mouth daily with supper.  rosuvastatin (CRESTOR) 20 MG tablet Take 20 mg by mouth daily.   tadalafil (CIALIS) 5 MG tablet Take 5 mg by mouth daily.   Testosterone 30 MG/ACT SOLN Place 1 application onto the skin daily.   albuterol (VENTOLIN HFA) 108 (90 Base) MCG/ACT inhaler Inhale 2 puffs into the lungs every 6 (six) hours as needed for wheezing or shortness of breath.   No current facility-administered medications on file prior to visit.     Allergies:   Patient has no known allergies.   Social History   Tobacco Use   Smoking status: Former    Types: Cigars    Smokeless tobacco: Never   Tobacco comments:    Smoked cigars socially, last one 2021  Vaping Use   Vaping Use: Never used  Substance Use Topics   Alcohol use: Yes    Alcohol/week: 3.0 standard drinks    Types: 1 Glasses of wine, 1 Cans of beer, 1 Shots of liquor per week   Drug use: Not Currently    Types: Marijuana    Family History: family history includes Deep vein thrombosis in his brother; Diabetes in his father; Heart disease in his father; Hypertension in his mother; Macular degeneration in his mother; Prostate cancer in his father.  ROS:   Please see the history of present illness. (+) Cough (+) Mechanical fall (+) Left LE frontal lesion with soreness and discoloration All other systems are reviewed and negative.    EKGs/Labs/Other Studies Reviewed:    The following studies were reviewed today:  Echo 11/11/2020:  1. Left ventricular ejection fraction, by estimation, is 60 to 65%. The  left ventricle has normal function. The left ventricle has no regional  wall motion abnormalities. Left ventricular diastolic parameters are  consistent with Grade I diastolic  dysfunction (impaired relaxation). There is the interventricular septum is  flattened in systole and diastole, consistent with right ventricular  pressure and volume overload.   2. RV improved. Right ventricular systolic function is normal. The right  ventricular size is normal.   3. Pericardial effusion resolved.   4. The mitral valve is normal in structure. No evidence of mitral valve  regurgitation. No evidence of mitral stenosis.   5. The aortic valve is normal in structure. There is moderate  calcification of the aortic valve. There is moderate thickening of the  aortic valve. Aortic valve regurgitation is mild. Mild to moderate aortic  valve sclerosis/calcification is present,  without any evidence of aortic stenosis.   6. The inferior vena cava is normal in size with greater than 50%  respiratory  variability, suggesting right atrial pressure of 3 mmHg.  Echo 07/21/2020: 1. Left ventricular ejection fraction, by estimation, is 55 to 60%. The  left ventricle has normal function. The left ventricle has no regional  wall motion abnormalities. Left ventricular diastolic parameters are  consistent with Grade I diastolic  dysfunction (impaired relaxation). There is the interventricular septum is  flattened in systole and diastole, consistent with right ventricular  pressure and volume overload.   2. 3D echo calculated RV EF is 33%. RV systolic pressure is likely  underestimated due to weak TR jet. Right ventricular systolic function is  moderately reduced. The right ventricular size is severely enlarged. There  is normal pulmonary artery systolic  pressure.   3. Right atrial size was mildly dilated.   4. A small pericardial effusion is present. The pericardial effusion is  circumferential. There is no evidence of cardiac tamponade.   5. The  mitral valve is normal in structure. No evidence of mitral valve  regurgitation.   6. The aortic valve is tricuspid. Aortic valve regurgitation is trivial.  Mild aortic valve sclerosis is present, with no evidence of aortic valve  stenosis.   7. There is mild dilatation of the ascending aorta, measuring 38 mm.   8. The inferior vena cava is dilated in size with <50% respiratory  variability, suggesting right atrial pressure of 15 mmHg.  EKG:  EKG is personally reviewed.   12/30/2020: EKG was not ordered. 08/11/2020: SR at 91 bpm, nonspecific st pattern  Recent Labs: 07/20/2020: B Natriuretic Peptide 21.6 07/23/2020: BUN 11; Creatinine, Ser 0.90; Hemoglobin 14.1; Magnesium 2.1; Platelets 153; Potassium 3.7; Sodium 134   Recent Lipid Panel No results found for: CHOL, TRIG, HDL, CHOLHDL, VLDL, LDLCALC, LDLDIRECT  Physical Exam:    VS:  BP 120/70   Pulse 78   Ht $R'5\' 6"'et$  (1.676 m)   Wt 264 lb 6.4 oz (119.9 kg)   BMI 42.68 kg/m     Wt Readings  from Last 3 Encounters:  12/30/20 264 lb 6.4 oz (119.9 kg)  11/03/20 258 lb 12.8 oz (117.4 kg)  08/11/20 248 lb 6.4 oz (112.7 kg)    GEN: Well nourished, well developed in no acute distress HEENT: Normal, moist mucous membranes NECK: No JVD CARDIAC: regular rhythm, normal S1 and S2, no rubs or gallops. No murmur. VASCULAR: Radial and DP pulses 2+ bilaterally. No carotid bruits RESPIRATORY:  Clear to auscultation without rales, wheezing or rhonchi  ABDOMEN: Soft, non-tender, non-distended MUSCULOSKELETAL:  Ambulates independently SKIN: Warm and dry, no edema. Left LE frontal lesion with tenderness and firmness, no erythema or warmth NEUROLOGIC:  Alert and oriented x 3. No focal neuro deficits noted. PSYCHIATRIC:  Normal affect    ASSESSMENT:    1. History of pulmonary embolism   2. Abnormal echocardiogram   3. Cardiac risk counseling   4. Counseling on health promotion and disease prevention   5. Diabetes mellitus type 2 in obese [facility])     PLAN:    Abnormal echocardiogram in the setting of PE History of pulmonary embolism, bilateral -RV size/function normal on repeat echo. Discussed that we could not estimate right sided filling pressures, but there was some septal flattening, concerning for elevated right sided filling pressures -continue anticoagulation with rivaroxaban, per PCP. He tried to come off of testosterone and felt terrible. Agree with suggestion to continue anticoagulation indefinitely while on testosterone. No bleeding issues  Type II diabetes, with obesity (BMI 42) -on semaglutide -on rosuvastatin 20 mg for prevention -no aspirin as he is on anticoagulation  Cardiac risk counseling and prevention recommendations: -recommend heart healthy/Mediterranean diet, with whole grains, fruits, vegetable, fish, lean meats, nuts, and olive oil. Limit salt. -recommend moderate walking, 3-5 times/week for 30-50 minutes each session. Aim for at least 150 minutes.week. Goal  should be pace of 3 miles/hours, or walking 1.5 miles in 30 minutes -recommend avoidance of tobacco products. Avoid excess alcohol.  Plan for follow up: 1 year or sooner as needed.  Samuel Dresser, MD, PhD, [facility]    Medication Adjustments/Labs and Tests Ordered: Current medicines are reviewed at length with the patient today.  Concerns regarding medicines are outlined above.   No orders of the defined types were placed in this encounter.  No orders of the defined types were placed in this encounter.  Patient Instructions  Medication Instructions:  Your Physician recommend you continue on your  current medication as directed.    *If you need a refill on your cardiac medications before your next appointment, please call your pharmacy*   Lab Work: None ordered today   Testing/Procedures: None ordered today   Follow-Up: At [facility], you and your health needs are our priority.  As part of our continuing mission to provide you with exceptional heart care, we have created designated Provider Care Teams.  These Care Teams include your primary Cardiologist (physician) and Advanced Practice Providers (APPs -  Physician Assistants and Nurse Practitioners) who all work together to provide you with the care you need, when you need it.  We recommend signing up for the patient portal called "MyChart".  Sign up information is provided on this After Visit Summary.  MyChart is used to connect with patients for Virtual Visits (Telemedicine).  Patients are able to view lab/test results, encounter notes, upcoming appointments, etc.  Non-urgent messages can be sent to your provider as well.   To learn more about what you can do with MyChart, go to [email or web address].    Your next appointment:   1 year(s)  The format for your next appointment:   In Person  Provider:   Buford Dresser, MD     [facility] Stumpf,acting as a scribe for Samuel Dresser, MD.,have documented all relevant documentation on the behalf of Samuel Dresser, MD,as directed by  Samuel Dresser, MD while in the presence of Samuel Dresser, MD.  I, Samuel Dresser, MD, have reviewed all documentation for this visit. The documentation on 12/30/20 for the exam, diagnosis, procedures, and orders are all accurate and complete.   Signed, Samuel Dresser, MD PhD 12/30/2020 12:51 PM   [place]

## 2020-12-30 ENCOUNTER — Ambulatory Visit (INDEPENDENT_AMBULATORY_CARE_PROVIDER_SITE_OTHER): Payer: BC Managed Care – PPO | Admitting: Cardiology

## 2020-12-30 ENCOUNTER — Encounter (HOSPITAL_BASED_OUTPATIENT_CLINIC_OR_DEPARTMENT_OTHER): Payer: Self-pay | Admitting: Cardiology

## 2020-12-30 ENCOUNTER — Other Ambulatory Visit: Payer: Self-pay

## 2020-12-30 VITALS — BP 120/70 | HR 78 | Ht 66.0 in | Wt 264.4 lb

## 2020-12-30 DIAGNOSIS — E1169 Type 2 diabetes mellitus with other specified complication: Secondary | ICD-10-CM

## 2020-12-30 DIAGNOSIS — Z86711 Personal history of pulmonary embolism: Secondary | ICD-10-CM

## 2020-12-30 DIAGNOSIS — Z7189 Other specified counseling: Secondary | ICD-10-CM | POA: Diagnosis not present

## 2020-12-30 DIAGNOSIS — R931 Abnormal findings on diagnostic imaging of heart and coronary circulation: Secondary | ICD-10-CM | POA: Diagnosis not present

## 2020-12-30 DIAGNOSIS — E669 Obesity, unspecified: Secondary | ICD-10-CM

## 2020-12-30 NOTE — Patient Instructions (Signed)

## 2021-01-12 DIAGNOSIS — H5203 Hypermetropia, bilateral: Secondary | ICD-10-CM | POA: Diagnosis not present

## 2021-01-12 DIAGNOSIS — H2513 Age-related nuclear cataract, bilateral: Secondary | ICD-10-CM | POA: Diagnosis not present

## 2021-01-12 DIAGNOSIS — H33301 Unspecified retinal break, right eye: Secondary | ICD-10-CM | POA: Diagnosis not present

## 2021-01-12 DIAGNOSIS — H52203 Unspecified astigmatism, bilateral: Secondary | ICD-10-CM | POA: Diagnosis not present

## 2021-01-13 DIAGNOSIS — Z7901 Long term (current) use of anticoagulants: Secondary | ICD-10-CM | POA: Diagnosis not present

## 2021-01-13 DIAGNOSIS — R7989 Other specified abnormal findings of blood chemistry: Secondary | ICD-10-CM | POA: Diagnosis not present

## 2021-01-13 DIAGNOSIS — I2782 Chronic pulmonary embolism: Secondary | ICD-10-CM | POA: Diagnosis not present

## 2021-01-13 DIAGNOSIS — Z6841 Body Mass Index (BMI) 40.0 and over, adult: Secondary | ICD-10-CM | POA: Diagnosis not present

## 2021-05-25 DIAGNOSIS — H6983 Other specified disorders of Eustachian tube, bilateral: Secondary | ICD-10-CM | POA: Diagnosis not present

## 2021-05-25 DIAGNOSIS — J0100 Acute maxillary sinusitis, unspecified: Secondary | ICD-10-CM | POA: Diagnosis not present

## 2021-05-25 DIAGNOSIS — Z6841 Body Mass Index (BMI) 40.0 and over, adult: Secondary | ICD-10-CM | POA: Diagnosis not present

## 2021-07-29 IMAGING — CT CT ANGIO CHEST
2 of 8 series · 19 of 36 positions shown · IV contrast (Omnipaque)
Comparison: None.

CLINICAL DATA: Hyper bubble ED pulmonary embolism

Shortness of breath
Rib and sternal pain
Recent airplane travel
EXAM:
CT ANGIOGRAPHY CHEST WITH CONTRAST
TECHNIQUE: Multidetector CT imaging of the chest was performed using the
standard protocol during bolus administration of intravenous
contrast. Multiplanar CT image reconstructions and MIPs were
obtained to evaluate the vascular anatomy.
CONTRAST:  100mL OMNIPAQUE IOHEXOL 350 MG/ML SOLN

[Series 6: pe thins · axial · 0.78mm/px · z∈[-301,-25]mm · 18 of 310 slices shown]
[im 17/310  lung]
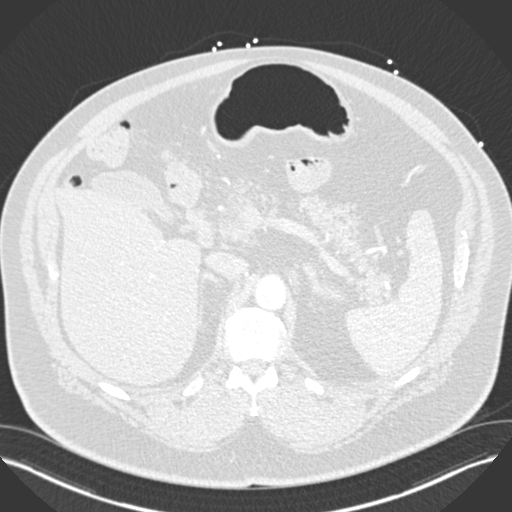
[im 33/310  mediastinal]
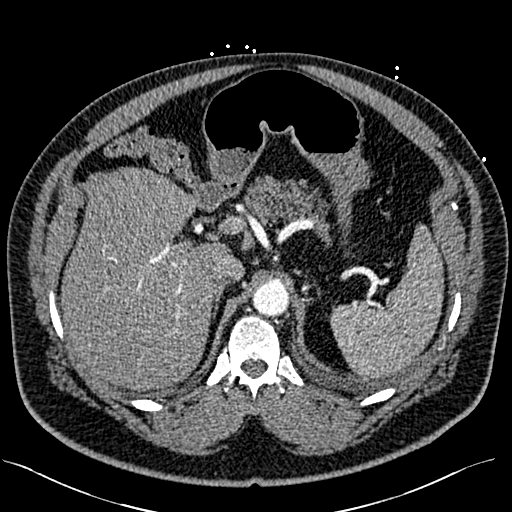
[im 49/310  lung]
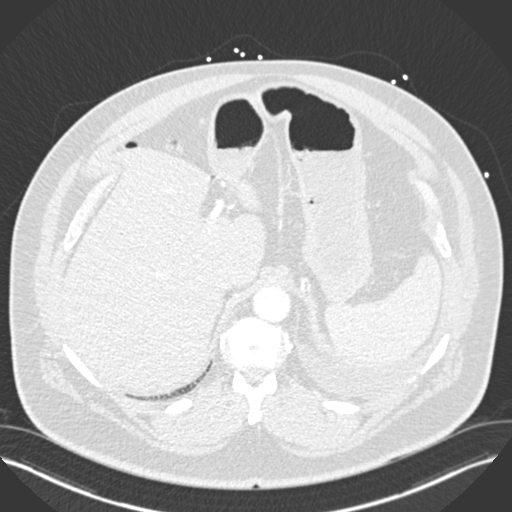
[im 66/310  mediastinal]
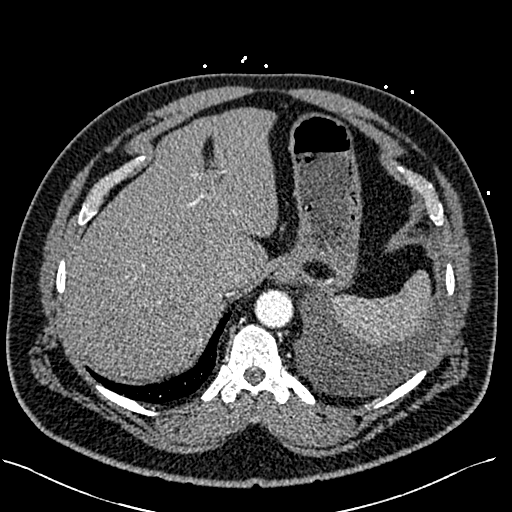
[im 82/310  lung]
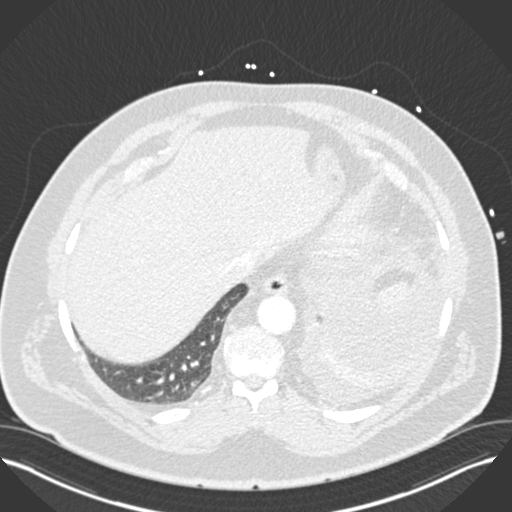
[im 98/310  mediastinal]
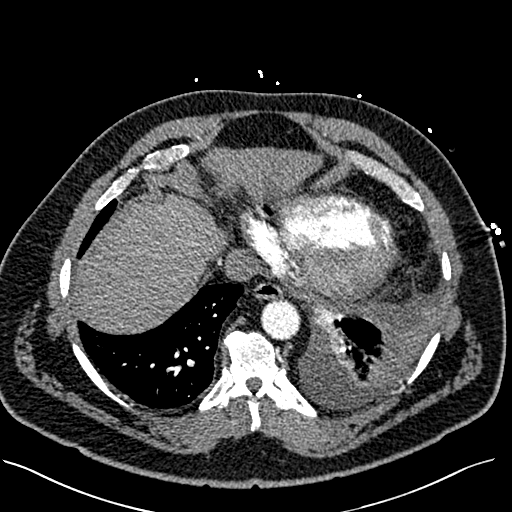
[im 114/310  lung]
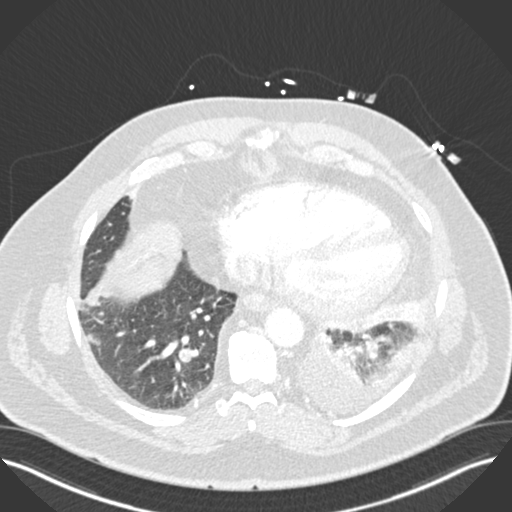
[im 131/310  mediastinal]
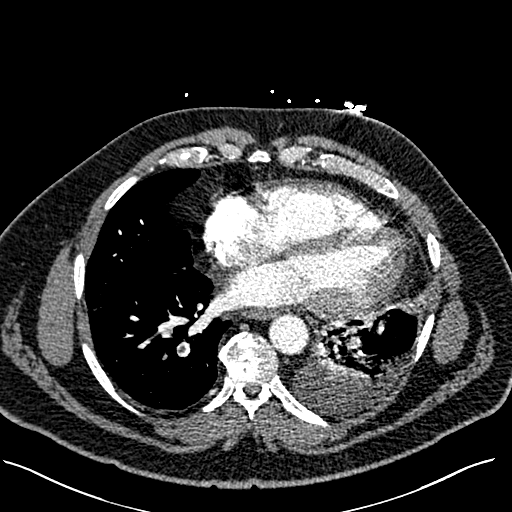
[im 147/310  lung]
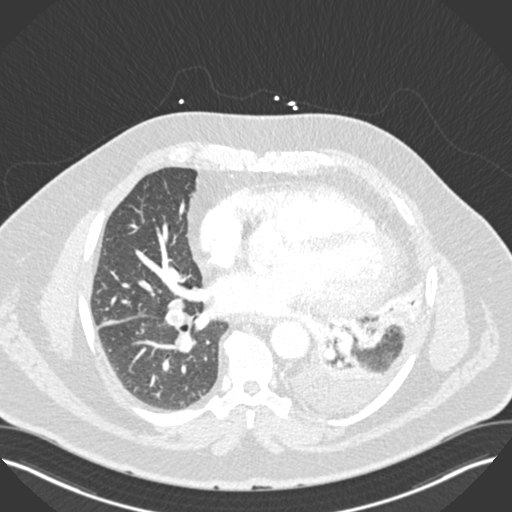
[im 163/310  mediastinal]
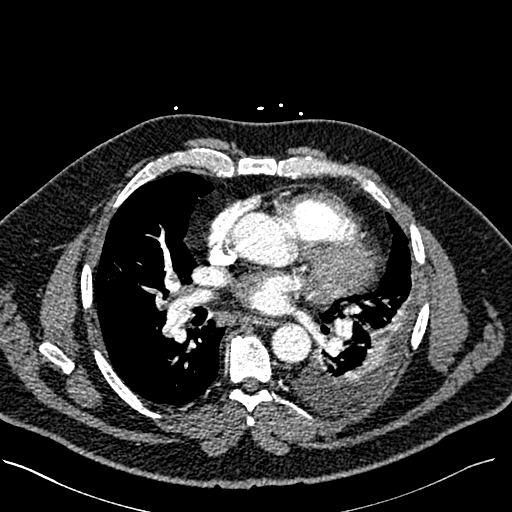
[im 179/310  lung]
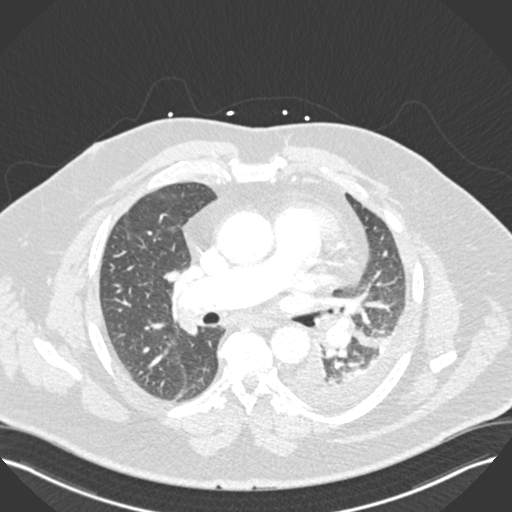
[im 196/310  mediastinal]
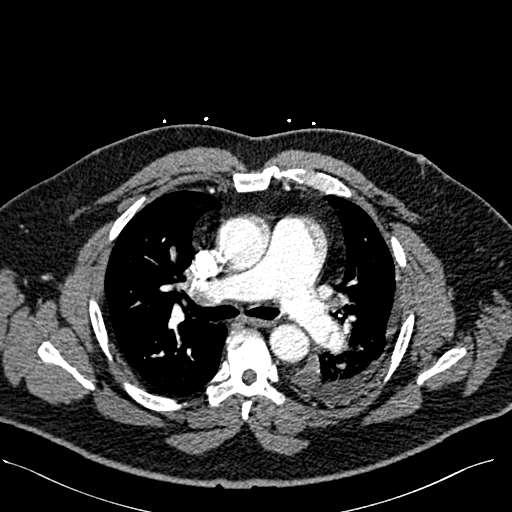
[im 212/310  lung]
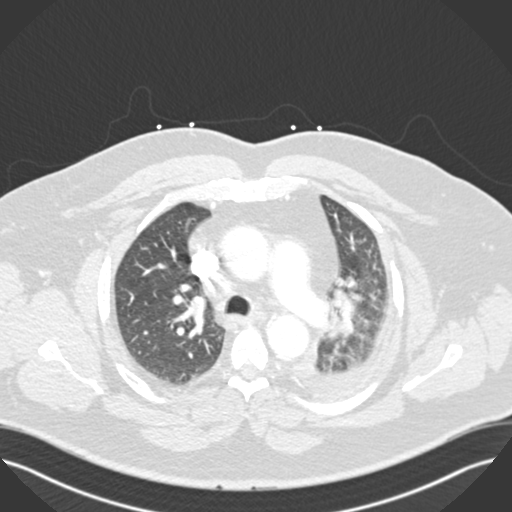
[im 228/310  mediastinal]
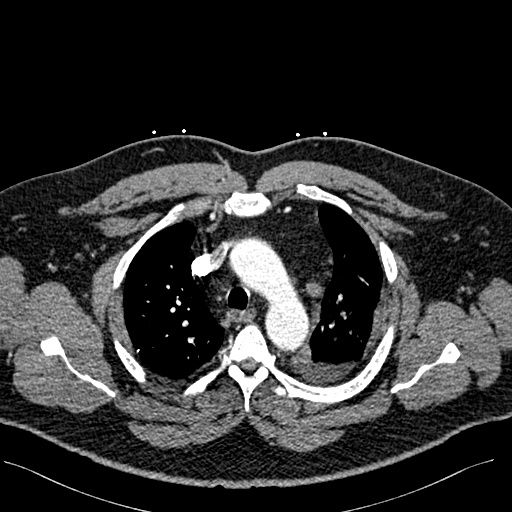
[im 244/310  lung]
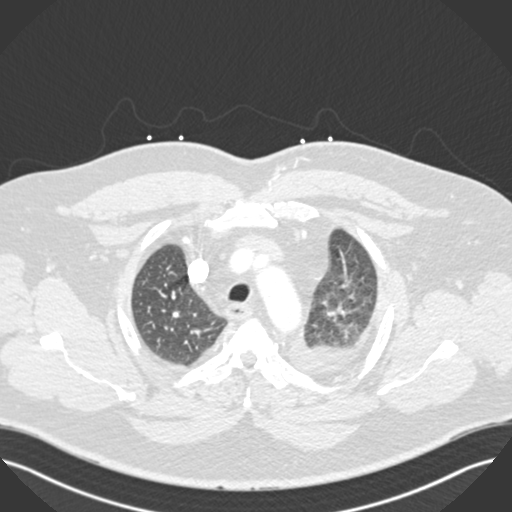
[im 261/310  mediastinal]
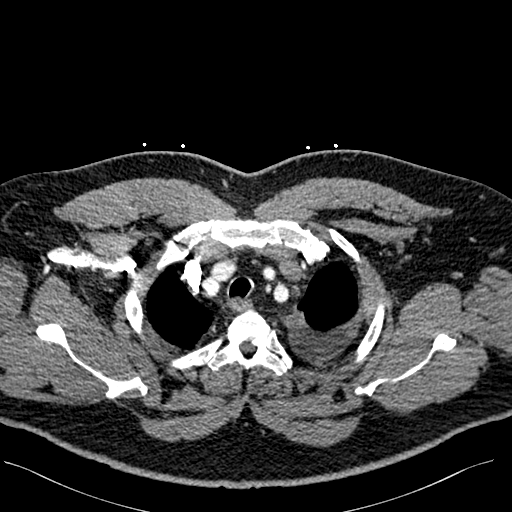
[im 277/310  lung]
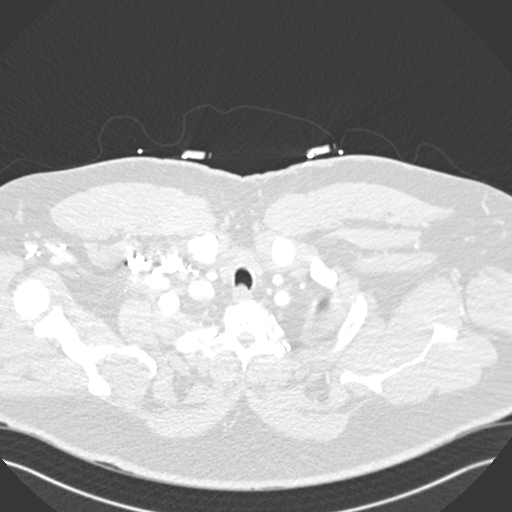
[im 293/310  mediastinal]
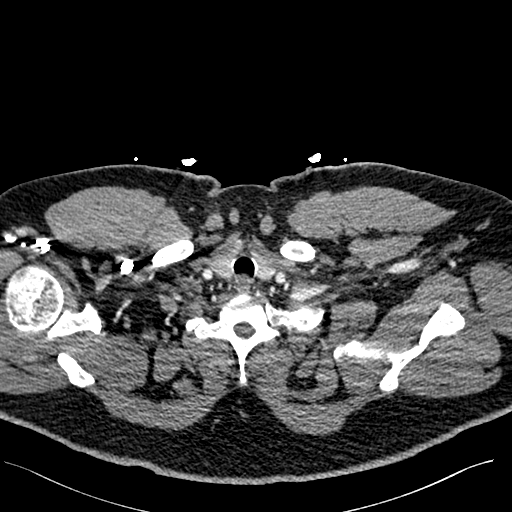

[Series 7: pe coronal mpr · coronal · 0.63mm/px · 1 of 133 slices shown]
[im 67/133  mediastinal]
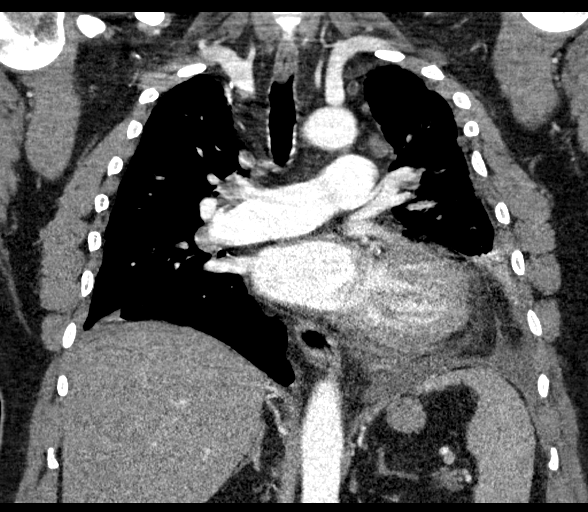

[19 of 36 positions shown; findings below may reference images not displayed]

FINDINGS: Cardiovascular: Heart size at upper limits of normal. Mediastinal
lipomatosis.

Bilateral pulmonary artery emboli involving distal main, lobar,
segmental, and subsegmental branches. Burden of clot is greater on
the right. Mildly elevated RV/LV ratio measuring 1.1.

Mediastinum/Nodes: No enlarged mediastinal axillary,
supraclavicular, or hilar lymph nodes.

Lungs/Pleura: Scattered calcified granulomas seen bilaterally.Small
left pleural effusion is present with adjacent atelectasis.

Upper Abdomen: No acute abnormality.

Musculoskeletal: No chest wall abnormality. No acute or significant
osseous findings.

Review of the MIP images confirms the above findings.
IMPRESSION: 1. Bilateral pulmonary artery embolism involving distal main, lobar,
segmental and subsegmental branches. Clot burden is greater on the
right.
2. Small left pleural effusion with adjacent atelectasis.
3. Mildly elevated RV/LV ratio measuring 1.1.

## 2021-07-29 IMAGING — DX DG CHEST 1V PORT
1 series · 1 of 1 positions shown · non-contrast
Comparison: None.

CLINICAL DATA: Shortness of breath.

EXAM:
PORTABLE CHEST 1 VIEW

[chest ap]
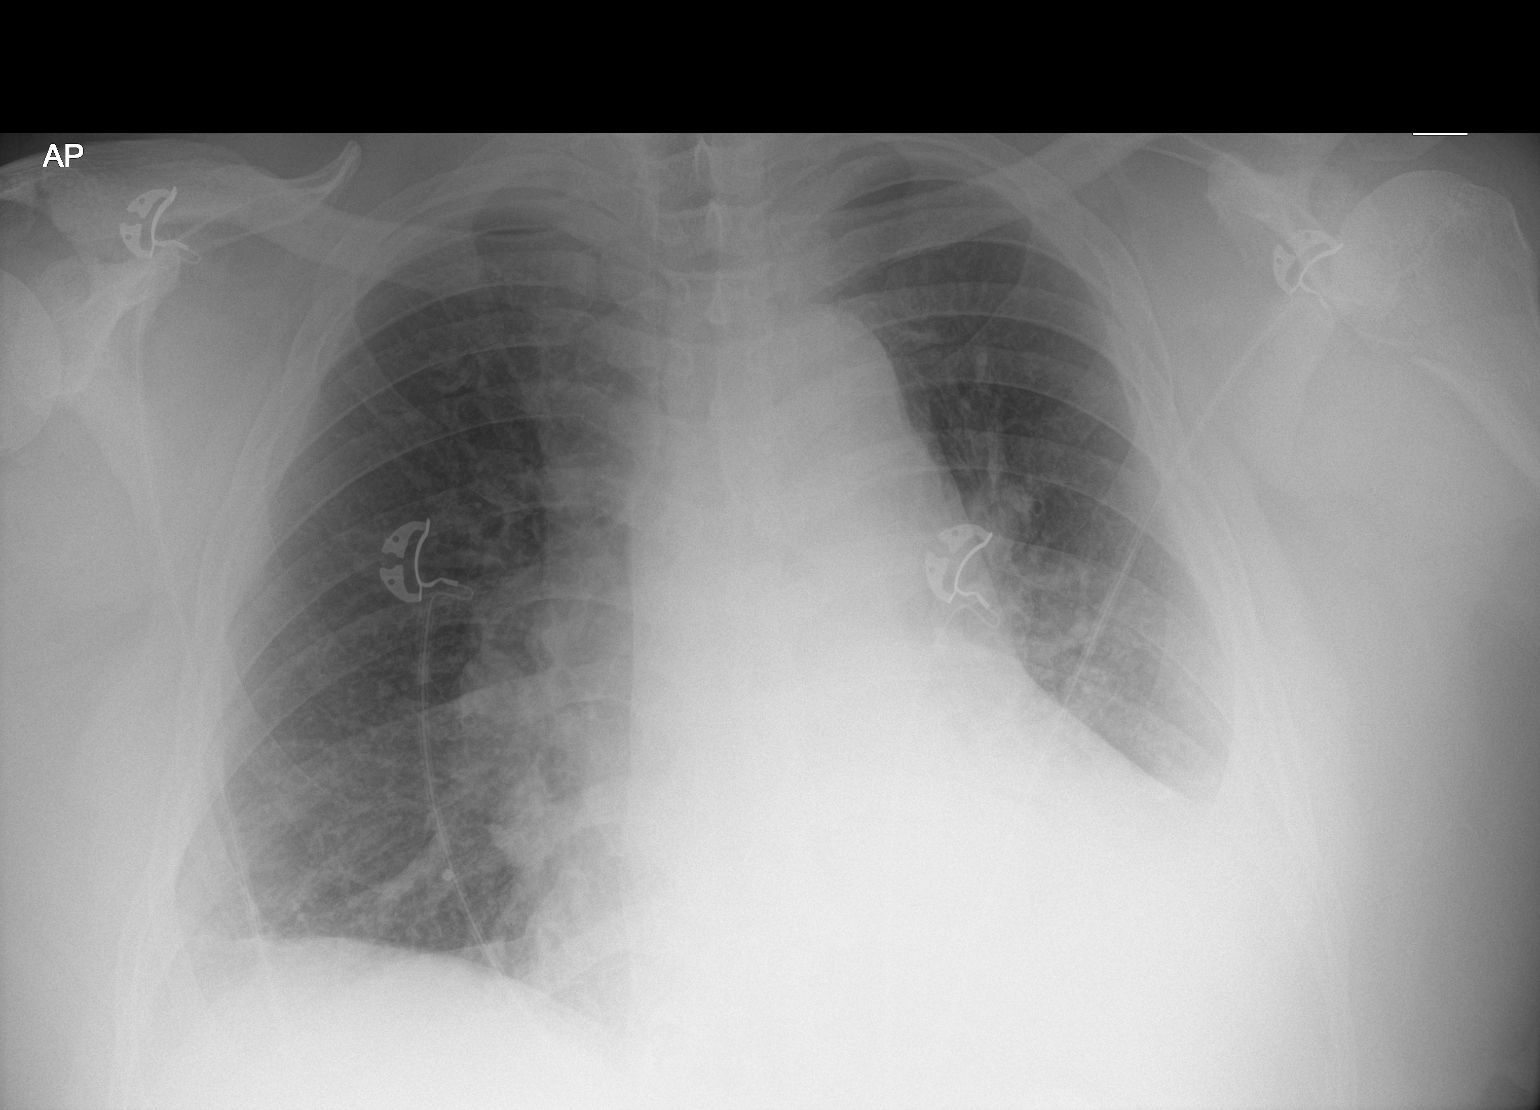

[1 of 1 positions shown; findings below may reference images not displayed]

FINDINGS: 2515 hours. Left base collapse/consolidation with small to moderate
left pleural effusion. No focal airspace consolidation or
substantial effusion in the right lung. The cardio pericardial
silhouette is enlarged. There is pulmonary vascular congestion
without overt pulmonary edema. The visualized bony structures of the
thorax show no acute abnormality. Telemetry leads overlie the chest.
IMPRESSION: Left base collapse/consolidation with small to moderate left pleural
effusion.

Cardiomegaly with vascular congestion.

## 2021-09-13 DIAGNOSIS — E7800 Pure hypercholesterolemia, unspecified: Secondary | ICD-10-CM | POA: Diagnosis not present

## 2021-09-13 DIAGNOSIS — R7303 Prediabetes: Secondary | ICD-10-CM | POA: Diagnosis not present

## 2021-09-13 DIAGNOSIS — E349 Endocrine disorder, unspecified: Secondary | ICD-10-CM | POA: Diagnosis not present

## 2021-09-13 DIAGNOSIS — R972 Elevated prostate specific antigen [PSA]: Secondary | ICD-10-CM | POA: Diagnosis not present

## 2021-09-15 DIAGNOSIS — N529 Male erectile dysfunction, unspecified: Secondary | ICD-10-CM | POA: Diagnosis not present

## 2021-09-15 DIAGNOSIS — E349 Endocrine disorder, unspecified: Secondary | ICD-10-CM | POA: Diagnosis not present

## 2021-09-15 DIAGNOSIS — Z0000 Encounter for general adult medical examination without abnormal findings: Secondary | ICD-10-CM | POA: Diagnosis not present

## 2021-09-15 DIAGNOSIS — E7800 Pure hypercholesterolemia, unspecified: Secondary | ICD-10-CM | POA: Diagnosis not present
